# Patient Record
Sex: Male | Born: 1995
Health system: Southern US, Community
[De-identification: ages and names within clinical notes are randomized; demographics above are authoritative.]

## PROBLEM LIST (undated history)

## (undated) DIAGNOSIS — J45909 Unspecified asthma, uncomplicated: Secondary | ICD-10-CM

---
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
## 2017-05-12 ENCOUNTER — Encounter (HOSPITAL_COMMUNITY): Payer: Self-pay | Admitting: *Deleted

## 2017-05-12 DIAGNOSIS — R197 Diarrhea, unspecified: Secondary | ICD-10-CM | POA: Insufficient documentation

## 2017-05-12 DIAGNOSIS — R112 Nausea with vomiting, unspecified: Secondary | ICD-10-CM | POA: Diagnosis present

## 2017-05-12 LAB — CBC
HEMATOCRIT: 40.6 % (ref 39.0–52.0)
HEMOGLOBIN: 14.2 g/dL (ref 13.0–17.0)
MCH: 27.9 pg (ref 26.0–34.0)
MCHC: 35 g/dL (ref 30.0–36.0)
MCV: 79.8 fL (ref 78.0–100.0)
Platelets: 240 10*3/uL (ref 150–400)
RBC: 5.09 MIL/uL (ref 4.22–5.81)
RDW: 12.5 % (ref 11.5–15.5)
WBC: 9 10*3/uL (ref 4.0–10.5)

## 2017-05-12 LAB — COMPREHENSIVE METABOLIC PANEL
ALT: 14 U/L — ABNORMAL LOW (ref 17–63)
ANION GAP: 14 (ref 5–15)
AST: 23 U/L (ref 15–41)
Albumin: 4.7 g/dL (ref 3.5–5.0)
Alkaline Phosphatase: 43 U/L (ref 38–126)
BUN: 17 mg/dL (ref 6–20)
CO2: 23 mmol/L (ref 22–32)
Calcium: 9.6 mg/dL (ref 8.9–10.3)
Chloride: 103 mmol/L (ref 101–111)
Creatinine, Ser: 1.16 mg/dL (ref 0.61–1.24)
GFR calc Af Amer: >60 mL/min (ref >60)
GFR calc non Af Amer: >60 mL/min (ref >60)
GLUCOSE: 90 mg/dL (ref 65–99)
POTASSIUM: 3.6 mmol/L (ref 3.5–5.1)
SODIUM: 140 mmol/L (ref 135–145)
Total Bilirubin: 1.2 mg/dL (ref 0.3–1.2)
Total Protein: 7.9 g/dL (ref 6.5–8.1)

## 2017-05-12 LAB — URINALYSIS, ROUTINE W REFLEX MICROSCOPIC
BACTERIA UA: NONE SEEN
Bilirubin Urine: NEGATIVE
Glucose, UA: NEGATIVE mg/dL
Hgb urine dipstick: NEGATIVE
Ketones, ur: 80 mg/dL — AB
Leukocytes, UA: NEGATIVE
Nitrite: NEGATIVE
PROTEIN: 30 mg/dL — AB
SQUAMOUS EPITHELIAL / LPF: NONE SEEN
Specific Gravity, Urine: 1.029 (ref 1.005–1.030)
pH: 7 (ref 5.0–8.0)

## 2017-05-12 LAB — LIPASE, BLOOD: Lipase: 29 U/L (ref 11–51)

## 2017-05-12 NOTE — ED Triage Notes (Signed)
Pt mother reports vomiting since Sunday, has been using Phenergan (last dose this afternoon without relief) Se[facility], dx with gastritis. Seen at a doctor today as well, and directed here for further eval.

## 2017-05-13 ENCOUNTER — Emergency Department (HOSPITAL_COMMUNITY)
Admission: EM | Admit: 2017-05-13 | Discharge: 2017-05-13 | Disposition: A | Discharge status: Home / Self Care | Payer: PRIVATE HEALTH INSURANCE | Attending: Emergency Medicine | Admitting: Emergency Medicine

## 2017-05-13 ENCOUNTER — Other Ambulatory Visit: Payer: Self-pay

## 2017-05-13 DIAGNOSIS — R197 Diarrhea, unspecified: Secondary | ICD-10-CM

## 2017-05-13 DIAGNOSIS — R112 Nausea with vomiting, unspecified: Secondary | ICD-10-CM

## 2017-05-13 MED ORDER — ONDANSETRON 8 MG PO TBDP: 8.0000 mg | ORAL_TABLET | Freq: Three times a day (TID) | ORAL | 0 refills | Status: DC | PRN

## 2017-05-13 MED ORDER — SODIUM CHLORIDE 0.9 % IV BOLUS (SEPSIS)
1000.0000 mL | Freq: Once | INTRAVENOUS | Status: AC
Start: 2017-05-13 — End: 2017-05-13
  Administered 2017-05-13: 1000 mL via INTRAVENOUS

## 2017-05-13 MED ORDER — ONDANSETRON HCL 4 MG/2ML IJ SOLN
4.0000 mg | Freq: Once | INTRAMUSCULAR | Status: AC
  Administered 2017-05-13: 4 mg via INTRAVENOUS
  Filled 2017-05-13: qty 2

## 2017-05-13 NOTE — ED Provider Notes (Signed)
[facility] EMERGENCY DEPARTMENT Provider Note   CSN: [id number] Arrival date & time: 05/12/17  1844     History   Chief Complaint Chief Complaint  Patient presents with  . Emesis    HPI Luke Lopez is a 22 y.o. male.  The history is provided by the patient and a parent.  Emesis   This is a new problem. The current episode started more than 2 days ago. The problem has been gradually worsening. The emesis has an appearance of stomach contents. There has been no fever. Associated symptoms include diarrhea. Pertinent negatives include no abdominal pain, no chills, no cough, no fever and no sweats.  Patient presents with vomiting and diarrhea. He reports since last weekend patient has had multiple episodes of nonbloody vomiting, and a few episodes of nonbloody diarrhea. No abdominal pain, but does feel like he has some heartburn. No fever. He has no other medical problems He is already been seen in the emergency department in [place] for this, but is still having vomiting despite home nausea meds He is also been started on a PPI by an outpatient provider Mother's concern he may have "gallbladder issues "   PMH -none Soc hx - no travel Patient Active Problem List   Diagnosis Date Noted  . TENDINITIS, RIGHT HIP 01/05/2010    History reviewed. No pertinent surgical history.     Home Medications    Prior to Admission medications   Not on File    Family History No family history on file.  Social History Social History   Tobacco Use  . Smoking status: Never Smoker  Substance Use Topics  . Alcohol use: Yes  . Drug use: Yes    Types: Marijuana    Comment: last use monday night     Allergies   Patient has no known allergies.   Review of Systems Review of Systems  Constitutional: Negative for chills and fever.  Respiratory: Negative for cough.   Gastrointestinal: Positive for diarrhea and vomiting. Negative for abdominal pain.  All other systems  reviewed and are negative.    Physical Exam Updated Vital Signs BP 106/66 (BP Location: Left Arm)   Pulse 67   Temp 97.8 F (36.6 C) (Oral)   Resp 17   Ht 1.778 m (5\' 10" )   Wt 70.3 kg (155 lb)   SpO2 98%   BMI 22.24 kg/m   Physical Exam CONSTITUTIONAL: Well developed/well nourished HEAD: Normocephalic/atraumatic EYES: EOMI/PERRL, no icterus ENMT: Mucous membranes moist NECK: supple no meningeal signs SPINE/BACK:entire spine nontender CV: S1/S2 noted, no murmurs/rubs/gallops noted LUNGS: Lungs are clear to auscultation bilaterally, no apparent distress ABDOMEN: soft, nontender, no rebound or guarding, bowel sounds noted throughout abdomen GU:no cva tenderness NEURO: Pt is awake/alert/appropriate, moves all extremitiesx4.  No facial droop.   EXTREMITIES: pulses normal/equal, full ROM SKIN: warm, color normal PSYCH: no abnormalities of mood noted, alert and oriented to situation   ED Treatments / Results  Labs (all labs ordered are listed, but only abnormal results are displayed) Labs Reviewed  COMPREHENSIVE METABOLIC PANEL - Abnormal; Notable for the following components:      Result Value   ALT 14 (*)    All other components within normal limits  URINALYSIS, ROUTINE W REFLEX MICROSCOPIC - Abnormal; Notable for the following components:   APPearance HAZY (*)    Ketones, ur 80 (*)    Protein, ur 30 (*)    All other components within normal limits  LIPASE, BLOOD  CBC  EKG  EKG Interpretation None       Radiology No results found.  Procedures Procedures   Medications Ordered in ED Medications  sodium chloride 0.9 % bolus 1,000 mL (0 mLs Intravenous Stopped 05/13/17 0303)  ondansetron (ZOFRAN) injection 4 mg (4 mg Intravenous Given 05/13/17 0224)     Initial Impression / Assessment and Plan / ED Course  I have reviewed the triage vital signs and the nursing notes.  Pertinent labs results that were available during my care of the patient were reviewed  by me and considered in my medical decision making (see chart for details).     Patient well-appearing, no focal abdominal tenderness, labs overall reassuring Mother is convinced this could be his gallbladder. I did order a next a gallbladder ultrasound. I do not feel that CT imaging is required   Patient improved, eating crackers , eating chips.  He will return later in the day for ultrasound imaging.  Final Clinical Impressions(s) / ED Diagnoses   Final diagnoses:  Nausea vomiting and diarrhea    ED Discharge Orders        Ordered    US ABDOMEN LIMITED RUQ     05/13/17 0248    ondansetron (ZOFRAN ODT) 8 MG disintegrating tablet  Every 8 hours PRN     05/13/17 0332       Zadie RhineWickline, Gunnard Dorrance, MD 05/13/17 0740

## 2017-05-13 NOTE — Discharge Instructions (Addendum)
[phone] Call after 7:45am

## 2017-05-13 NOTE — ED Notes (Signed)
Pt resting comfortably and has not vomited since brought back to room

## 2017-05-13 NOTE — ED Notes (Signed)
Pt tolerating po liquids and po carbohydrates

## 2017-05-16 ENCOUNTER — Ambulatory Visit (HOSPITAL_COMMUNITY)
Admission: RE | Admit: 2017-05-16 | Discharge: 2017-05-16 | Disposition: A | Discharge status: Home / Self Care | Payer: PRIVATE HEALTH INSURANCE | Source: Ambulatory Visit | Attending: Emergency Medicine | Admitting: Emergency Medicine

## 2017-05-16 DIAGNOSIS — R1110 Vomiting, unspecified: Secondary | ICD-10-CM | POA: Diagnosis not present

## 2017-05-16 NOTE — ED Provider Notes (Addendum)
Abdominal ultrasound negative.  Patient did not wait for results.  He requested results to be faxed to local Medical [facility].  I have given the okay for that   Doug SouJacubowitz, Lizania Bouchard, MD 05/16/17 1222    Doug SouJacubowitz, Granite Godman, MD 05/16/17 [id number]

## 2018-05-19 DIAGNOSIS — J101 Influenza due to other identified influenza virus with other respiratory manifestations: Secondary | ICD-10-CM | POA: Diagnosis not present

## 2018-05-19 DIAGNOSIS — R6889 Other general symptoms and signs: Secondary | ICD-10-CM | POA: Diagnosis not present

## 2018-05-19 DIAGNOSIS — Z6824 Body mass index (BMI) 24.0-24.9, adult: Secondary | ICD-10-CM | POA: Diagnosis not present

## 2018-05-19 DIAGNOSIS — R070 Pain in throat: Secondary | ICD-10-CM | POA: Diagnosis not present

## 2018-08-13 ENCOUNTER — Encounter (HOSPITAL_COMMUNITY): Payer: Self-pay | Admitting: Emergency Medicine

## 2018-08-13 ENCOUNTER — Emergency Department (HOSPITAL_COMMUNITY): Payer: BC Managed Care – PPO

## 2018-08-13 ENCOUNTER — Emergency Department (HOSPITAL_COMMUNITY)
Admission: EM | Admit: 2018-08-13 | Discharge: 2018-08-13 | Disposition: A | Discharge status: Home / Self Care | Payer: BC Managed Care – PPO | Attending: Emergency Medicine | Admitting: Emergency Medicine

## 2018-08-13 ENCOUNTER — Other Ambulatory Visit: Payer: Self-pay

## 2018-08-13 DIAGNOSIS — R197 Diarrhea, unspecified: Secondary | ICD-10-CM | POA: Insufficient documentation

## 2018-08-13 DIAGNOSIS — R112 Nausea with vomiting, unspecified: Secondary | ICD-10-CM | POA: Insufficient documentation

## 2018-08-13 DIAGNOSIS — R1110 Vomiting, unspecified: Secondary | ICD-10-CM | POA: Diagnosis not present

## 2018-08-13 DIAGNOSIS — F17210 Nicotine dependence, cigarettes, uncomplicated: Secondary | ICD-10-CM | POA: Insufficient documentation

## 2018-08-13 LAB — COMPREHENSIVE METABOLIC PANEL
ALT: 21 U/L (ref 0–44)
AST: 24 U/L (ref 15–41)
Albumin: 4.8 g/dL (ref 3.5–5.0)
Alkaline Phosphatase: 51 U/L (ref 38–126)
Anion gap: 15 (ref 5–15)
BUN: 24 mg/dL — ABNORMAL HIGH (ref 6–20)
CO2: 24 mmol/L (ref 22–32)
Calcium: 9.4 mg/dL (ref 8.9–10.3)
Chloride: 97 mmol/L — ABNORMAL LOW (ref 98–111)
Creatinine, Ser: 1.37 mg/dL — ABNORMAL HIGH (ref 0.61–1.24)
GFR calc Af Amer: >60 mL/min (ref >60)
GFR calc non Af Amer: >60 mL/min (ref >60)
Glucose, Bld: 95 mg/dL (ref 70–99)
Potassium: 2.9 mmol/L — ABNORMAL LOW (ref 3.5–5.1)
Sodium: 136 mmol/L (ref 135–145)
Total Bilirubin: 1.3 mg/dL — ABNORMAL HIGH (ref 0.3–1.2)
Total Protein: 7.9 g/dL (ref 6.5–8.1)

## 2018-08-13 LAB — URINALYSIS, ROUTINE W REFLEX MICROSCOPIC
Bilirubin Urine: NEGATIVE
Glucose, UA: NEGATIVE mg/dL
Ketones, ur: 80 mg/dL — AB
Leukocytes,Ua: NEGATIVE
Nitrite: NEGATIVE
Protein, ur: NEGATIVE mg/dL
Specific Gravity, Urine: 1.034 — ABNORMAL HIGH (ref 1.005–1.030)
pH: 5 (ref 5.0–8.0)

## 2018-08-13 LAB — CBC WITH DIFFERENTIAL/PLATELET
Abs Immature Granulocytes: 0.04 10*3/uL (ref 0.00–0.07)
Basophils Absolute: 0 10*3/uL (ref 0.0–0.1)
Basophils Relative: 0 %
Eosinophils Absolute: 0 10*3/uL (ref 0.0–0.5)
Eosinophils Relative: 0 %
HCT: 45 % (ref 39.0–52.0)
Hemoglobin: 15.4 g/dL (ref 13.0–17.0)
Immature Granulocytes: 0 %
Lymphocytes Relative: 16 %
Lymphs Abs: 1.6 10*3/uL (ref 0.7–4.0)
MCH: 28.1 pg (ref 26.0–34.0)
MCHC: 34.2 g/dL (ref 30.0–36.0)
MCV: 82 fL (ref 80.0–100.0)
Monocytes Absolute: 1.2 10*3/uL — ABNORMAL HIGH (ref 0.1–1.0)
Monocytes Relative: 12 %
Neutro Abs: 7.1 10*3/uL (ref 1.7–7.7)
Neutrophils Relative %: 72 %
Platelets: 268 10*3/uL (ref 150–400)
RBC: 5.49 MIL/uL (ref 4.22–5.81)
RDW: 12.4 % (ref 11.5–15.5)
WBC: 10 10*3/uL (ref 4.0–10.5)
nRBC: 0 % (ref 0.0–0.2)

## 2018-08-13 LAB — ETHANOL: Alcohol, Ethyl (B): <10 mg/dL (ref <10)

## 2018-08-13 LAB — RAPID URINE DRUG SCREEN, HOSP PERFORMED
Amphetamines: NOT DETECTED
Barbiturates: NOT DETECTED
Benzodiazepines: NOT DETECTED
Cocaine: NOT DETECTED
Opiates: NOT DETECTED
Tetrahydrocannabinol: POSITIVE — AB

## 2018-08-13 LAB — LIPASE, BLOOD: Lipase: 27 U/L (ref 11–51)

## 2018-08-13 MED ORDER — FAMOTIDINE IN NACL 20-0.9 MG/50ML-% IV SOLN
20.0000 mg | Freq: Once | INTRAVENOUS | Status: AC
  Administered 2018-08-13: 20 mg via INTRAVENOUS
  Filled 2018-08-13: qty 50

## 2018-08-13 MED ORDER — SODIUM CHLORIDE 0.9 % IV BOLUS
1000.0000 mL | Freq: Once | INTRAVENOUS | Status: AC
  Administered 2018-08-13: 19:00:00 1000 mL via INTRAVENOUS

## 2018-08-13 MED ORDER — POTASSIUM CHLORIDE CRYS ER 20 MEQ PO TBCR
40.0000 meq | EXTENDED_RELEASE_TABLET | Freq: Once | ORAL | Status: AC
Start: 2018-08-13 — End: 2018-08-13
  Administered 2018-08-13: 19:00:00 40 meq via ORAL
  Filled 2018-08-13: qty 2

## 2018-08-13 MED ORDER — SODIUM CHLORIDE 0.9 % IV BOLUS
1000.0000 mL | Freq: Once | INTRAVENOUS | Status: AC
  Administered 2018-08-13: 1000 mL via INTRAVENOUS

## 2018-08-13 MED ORDER — PROMETHAZINE HCL 25 MG/ML IJ SOLN
12.5000 mg | Freq: Once | INTRAMUSCULAR | Status: AC
  Administered 2018-08-13: 12.5 mg via INTRAVENOUS
  Filled 2018-08-13: qty 1

## 2018-08-13 MED ORDER — PROMETHAZINE HCL 25 MG PO TABS: 25.0000 mg | ORAL_TABLET | Freq: Four times a day (QID) | ORAL | 0 refills | Status: DC | PRN

## 2018-08-13 NOTE — Discharge Instructions (Signed)
Take the prescriptions as directed.  Increase your fluid intake (ie:  Gatoraide) for the next few days, as discussed.  Eat a bland diet and advance to your regular diet slowly as you can tolerate it.   Avoid full strength juices, as well as milk and milk products until your diarrhea has resolved.   Call your regular medical doctor Monday to schedule a follow up appointment this week.  Return to the Emergency Department immediately sooner if worsening.

## 2018-08-13 NOTE — ED Notes (Signed)
Pt given sprite 

## 2018-08-13 NOTE — ED Triage Notes (Signed)
Patient states nausea, vomiting, and diarrhea x 4 days. States he has had episodes like this before and that they are based on anxiety.

## 2018-08-13 NOTE — ED Provider Notes (Signed)
[facility] EMERGENCY DEPARTMENT Provider Note   CSN: [id number] Arrival date & time: 08/13/18  1708    History   Chief Complaint Chief Complaint  Patient presents with  . Emesis  . Nausea    HPI Luke Lopez is a 23 y.o. male.     HPI  Pt was seen at 1715. Per pt, c/o gradual onset and persistence of multiple intermittent episodes of N/V/D that began 3 days ago. Pt states his symptoms began after he "got upset" after a friend's funeral. States he has had these symptoms previously, generally occurring when he has anxiety. Pt has not f/u with PMD or GI MD for his symptoms. Denies abd pain, no CP/SOB, no back pain, no fevers, no black or blood in stools or emesis.    History reviewed. No pertinent past medical history.  Patient Active Problem List   Diagnosis Date Noted  . TENDINITIS, RIGHT HIP 01/05/2010    No past surgical history on file.      Home Medications    Prior to Admission medications   Medication Sig Start Date End Date Taking? Authorizing Provider  ondansetron (ZOFRAN ODT) 8 MG disintegrating tablet Take 1 tablet (8 mg total) by mouth every 8 (eight) hours as needed. 8mg  ODT q4 hours prn nausea 05/13/17   Zadie Rhine, MD    Family History No family history on file.  Social History Social History   Tobacco Use  . Smoking status: Current Every Day Smoker    Packs/day: 0.50    Types: Cigarettes  . Smokeless tobacco: Never Used  Substance Use Topics  . Alcohol use: Yes  . Drug use: Yes    Types: Marijuana    Comment: last use monday night     Allergies   Patient has no known allergies.   Review of Systems Review of Systems ROS: Statement: All systems negative except as marked or noted in the HPI; Constitutional: Negative for fever and chills. ; ; Eyes: Negative for eye pain, redness and discharge. ; ; ENMT: Negative for ear pain, hoarseness, nasal congestion, sinus pressure and sore throat. ; ; Cardiovascular: Negative for  chest pain, palpitations, diaphoresis, dyspnea and peripheral edema. ; ; Respiratory: Negative for cough, wheezing and stridor. ; ; Gastrointestinal: +N/V/D. Negative for abdominal pain, blood in stool, hematemesis, jaundice and rectal bleeding. . ; ; Genitourinary: Negative for dysuria, flank pain and hematuria. ; ; Musculoskeletal: Negative for back pain and neck pain. Negative for swelling and trauma.; ; Skin: Negative for pruritus, rash, abrasions, blisters, bruising and skin lesion.; ; Neuro: Negative for headache, lightheadedness and neck stiffness. Negative for weakness, altered level of consciousness, altered mental status, extremity weakness, paresthesias, involuntary movement, seizure and syncope.       Physical Exam Updated Vital Signs BP 136/78 (BP Location: Right Arm)   Pulse (!) 49   Temp 99.3 F (37.4 C) (Oral)   Resp 16   Ht 5\' 10"  (1.778 m)   Wt 77.1 kg   SpO2 99%   BMI 24.39 kg/m   Physical Exam 1720: Physical examination:  Nursing notes reviewed; Vital signs and O2 SAT reviewed;  Constitutional: Well developed, Well nourished, Well hydrated, In no acute distress; Head:  Normocephalic, atraumatic; Eyes: EOMI, PERRL, No scleral icterus; ENMT: Mouth and pharynx normal, Mucous membranes moist; Neck: Supple, Full range of motion, No lymphadenopathy; Cardiovascular: Regular rate and rhythm, No gallop; Respiratory: Breath sounds clear & equal bilaterally, No wheezes.  Speaking full sentences with ease, Normal respiratory  effort/excursion; Chest: Nontender, Movement normal; Abdomen: Soft, Nontender, Nondistended, Normal bowel sounds; Genitourinary: No CVA tenderness; Extremities: Peripheral pulses normal, No tenderness, No edema, No calf edema or asymmetry.; Neuro: AA&Ox3, Major CN grossly intact.  Speech clear. No gross focal motor or sensory deficits in extremities.; Skin: Color normal, Warm, Dry.   ED Treatments / Results  Labs (all labs ordered are listed, but only abnormal  results are displayed)   EKG None  Radiology   Procedures Procedures (including critical care time)  Medications Ordered in ED Medications  sodium chloride 0.9 % bolus 1,000 mL (has no administration in time range)  promethazine (PHENERGAN) injection 12.5 mg (has no administration in time range)  famotidine (PEPCID) IVPB 20 mg premix (has no administration in time range)     Initial Impression / Assessment and Plan / ED Course  I have reviewed the triage vital signs and the nursing notes.  Pertinent labs & imaging results that were available during my care of the patient were reviewed by me and considered in my medical decision making (see chart for details).     MDM Reviewed: previous chart, nursing note and vitals Reviewed previous: labs Interpretation: labs and x-ray    Results for orders placed or performed during the hospital encounter of 08/13/18  Urine rapid drug screen (hosp performed)  Result Value Ref Range   Opiates NONE DETECTED NONE DETECTED   Cocaine NONE DETECTED NONE DETECTED   Benzodiazepines NONE DETECTED NONE DETECTED   Amphetamines NONE DETECTED NONE DETECTED   Tetrahydrocannabinol POSITIVE (A) NONE DETECTED   Barbiturates NONE DETECTED NONE DETECTED  Comprehensive metabolic panel  Result Value Ref Range   Sodium 136 135 - 145 mmol/L   Potassium 2.9 (L) 3.5 - 5.1 mmol/L   Chloride 97 (L) 98 - 111 mmol/L   CO2 24 22 - 32 mmol/L   Glucose, Bld 95 70 - 99 mg/dL   BUN 24 (H) 6 - 20 mg/dL   Creatinine, Ser 4.091.37 (H) 0.61 - 1.24 mg/dL   Calcium 9.4 8.9 - 81.110.3 mg/dL   Total Protein 7.9 6.5 - 8.1 g/dL   Albumin 4.8 3.5 - 5.0 g/dL   AST 24 15 - 41 U/L   ALT 21 0 - 44 U/L   Alkaline Phosphatase 51 38 - 126 U/L   Total Bilirubin 1.3 (H) 0.3 - 1.2 mg/dL   GFR calc non Af Amer >60 >60 mL/min   GFR calc Af Amer >60 >60 mL/min   Anion gap 15 5 - 15  Ethanol  Result Value Ref Range   Alcohol, Ethyl (B) <10 <10 mg/dL  Lipase, blood  Result Value Ref  Range   Lipase 27 11 - 51 U/L  CBC with Differential  Result Value Ref Range   WBC 10.0 4.0 - 10.5 K/uL   RBC 5.49 4.22 - 5.81 MIL/uL   Hemoglobin 15.4 13.0 - 17.0 g/dL   HCT 91.445.0 78.239.0 - 95.652.0 %   MCV 82.0 80.0 - 100.0 fL   MCH 28.1 26.0 - 34.0 pg   MCHC 34.2 30.0 - 36.0 g/dL   RDW 21.312.4 08.611.5 - 57.815.5 %   Platelets 268 150 - 400 K/uL   nRBC 0.0 0.0 - 0.2 %   Neutrophils Relative % 72 %   Neutro Abs 7.1 1.7 - 7.7 K/uL   Lymphocytes Relative 16 %   Lymphs Abs 1.6 0.7 - 4.0 K/uL   Monocytes Relative 12 %   Monocytes Absolute 1.2 (H) 0.1 - 1.0 K/uL  Eosinophils Relative 0 %   Eosinophils Absolute 0.0 0.0 - 0.5 K/uL   Basophils Relative 0 %   Basophils Absolute 0.0 0.0 - 0.1 K/uL   Immature Granulocytes 0 %   Abs Immature Granulocytes 0.04 0.00 - 0.07 K/uL  Urinalysis, Routine w reflex microscopic  Result Value Ref Range   Color, Urine YELLOW YELLOW   APPearance CLEAR CLEAR   Specific Gravity, Urine 1.034 (H) 1.005 - 1.030   pH 5.0 5.0 - 8.0   Glucose, UA NEGATIVE NEGATIVE mg/dL   Hgb urine dipstick SMALL (A) NEGATIVE   Bilirubin Urine NEGATIVE NEGATIVE   Ketones, ur 80 (A) NEGATIVE mg/dL   Protein, ur NEGATIVE NEGATIVE mg/dL   Nitrite NEGATIVE NEGATIVE   Leukocytes,Ua NEGATIVE NEGATIVE   RBC / HPF 0-5 0 - 5 RBC/hpf   WBC, UA 0-5 0 - 5 WBC/hpf   Bacteria, UA RARE (A) NONE SEEN   Mucus PRESENT    Dg Abd Acute W/chest Result Date: 08/13/2018 CLINICAL DATA:  23 year old male with a history of vomiting EXAM: DG ABDOMEN ACUTE W/ 1V CHEST COMPARISON:  None. FINDINGS: Chest: Cardiomediastinal silhouette within normal limits in size and contour. No evidence of central vascular congestion. No pneumothorax or pleural effusion. Abdomen: Gas within stomach, small bowel, colon. No abnormal distension. No air-fluid levels. No radiopaque foreign body. No unexpected soft tissue density or calcification. No displaced fracture IMPRESSION: Chest: No radiographic evidence of acute cardiopulmonary  disease. Abdomen: Normal bowel gas pattern. Electronically Signed   By: Gilmer Mor D.O.   On: 08/13/2018 18:43    1935:  Potassium repleted PO. BUN/Cr mildly elevated from baseline; IVF given. Pt has tol PO well while in the ED without N/V.  No stooling while in the ED.  Abd remains benign, resps easy, VSS. Feels better and wants to go home now. Tx symptomatically at this time. Dx and testing d/w pt.  Questions answered.  Verb understanding, agreeable to d/c home with outpt f/u.    Final Clinical Impressions(s) / ED Diagnoses   Final diagnoses:  None    ED Discharge Orders    None       Samuel Jester, DO 08/17/18 4034

## 2018-08-15 DIAGNOSIS — R112 Nausea with vomiting, unspecified: Secondary | ICD-10-CM | POA: Diagnosis not present

## 2018-08-15 DIAGNOSIS — Z6822 Body mass index (BMI) 22.0-22.9, adult: Secondary | ICD-10-CM | POA: Diagnosis not present

## 2018-08-18 DIAGNOSIS — K2990 Gastroduodenitis, unspecified, without bleeding: Secondary | ICD-10-CM | POA: Diagnosis not present

## 2018-08-18 DIAGNOSIS — K219 Gastro-esophageal reflux disease without esophagitis: Secondary | ICD-10-CM | POA: Diagnosis not present

## 2018-08-19 ENCOUNTER — Encounter (HOSPITAL_COMMUNITY): Payer: Self-pay | Admitting: Emergency Medicine

## 2018-08-19 ENCOUNTER — Emergency Department (HOSPITAL_COMMUNITY)
Admission: EM | Admit: 2018-08-19 | Discharge: 2018-08-19 | Disposition: A | Discharge status: Home / Self Care | Payer: BC Managed Care – PPO | Attending: Emergency Medicine | Admitting: Emergency Medicine

## 2018-08-19 ENCOUNTER — Other Ambulatory Visit: Payer: Self-pay

## 2018-08-19 DIAGNOSIS — R112 Nausea with vomiting, unspecified: Secondary | ICD-10-CM | POA: Diagnosis not present

## 2018-08-19 DIAGNOSIS — Z79899 Other long term (current) drug therapy: Secondary | ICD-10-CM | POA: Insufficient documentation

## 2018-08-19 DIAGNOSIS — E860 Dehydration: Secondary | ICD-10-CM | POA: Diagnosis not present

## 2018-08-19 DIAGNOSIS — E876 Hypokalemia: Secondary | ICD-10-CM | POA: Insufficient documentation

## 2018-08-19 DIAGNOSIS — F17210 Nicotine dependence, cigarettes, uncomplicated: Secondary | ICD-10-CM | POA: Insufficient documentation

## 2018-08-19 LAB — COMPREHENSIVE METABOLIC PANEL
ALT: 13 U/L (ref 0–44)
AST: 19 U/L (ref 15–41)
Albumin: 4.9 g/dL (ref 3.5–5.0)
Alkaline Phosphatase: 48 U/L (ref 38–126)
Anion gap: 14 (ref 5–15)
BUN: 18 mg/dL (ref 6–20)
CO2: 27 mmol/L (ref 22–32)
Calcium: 9.2 mg/dL (ref 8.9–10.3)
Chloride: 92 mmol/L — ABNORMAL LOW (ref 98–111)
Creatinine, Ser: 1.24 mg/dL (ref 0.61–1.24)
GFR calc Af Amer: >60 mL/min (ref >60)
GFR calc non Af Amer: >60 mL/min (ref >60)
Glucose, Bld: 94 mg/dL (ref 70–99)
Potassium: 2.9 mmol/L — ABNORMAL LOW (ref 3.5–5.1)
Sodium: 133 mmol/L — ABNORMAL LOW (ref 135–145)
Total Bilirubin: 1.4 mg/dL — ABNORMAL HIGH (ref 0.3–1.2)
Total Protein: 8.1 g/dL (ref 6.5–8.1)

## 2018-08-19 LAB — RAPID URINE DRUG SCREEN, HOSP PERFORMED
Amphetamines: NOT DETECTED
Barbiturates: NOT DETECTED
Benzodiazepines: NOT DETECTED
Cocaine: NOT DETECTED
Opiates: NOT DETECTED
Tetrahydrocannabinol: POSITIVE — AB

## 2018-08-19 LAB — CBC WITH DIFFERENTIAL/PLATELET
Abs Immature Granulocytes: 0.02 10*3/uL (ref 0.00–0.07)
Basophils Absolute: 0 10*3/uL (ref 0.0–0.1)
Basophils Relative: 0 %
Eosinophils Absolute: 0 10*3/uL (ref 0.0–0.5)
Eosinophils Relative: 0 %
HCT: 46.9 % (ref 39.0–52.0)
Hemoglobin: 16.2 g/dL (ref 13.0–17.0)
Immature Granulocytes: 0 %
Lymphocytes Relative: 21 %
Lymphs Abs: 1.6 10*3/uL (ref 0.7–4.0)
MCH: 27.8 pg (ref 26.0–34.0)
MCHC: 34.5 g/dL (ref 30.0–36.0)
MCV: 80.6 fL (ref 80.0–100.0)
Monocytes Absolute: 0.9 10*3/uL (ref 0.1–1.0)
Monocytes Relative: 12 %
Neutro Abs: 5.1 10*3/uL (ref 1.7–7.7)
Neutrophils Relative %: 67 %
Platelets: 314 10*3/uL (ref 150–400)
RBC: 5.82 MIL/uL — ABNORMAL HIGH (ref 4.22–5.81)
RDW: 12 % (ref 11.5–15.5)
WBC: 7.7 10*3/uL (ref 4.0–10.5)
nRBC: 0 % (ref 0.0–0.2)

## 2018-08-19 LAB — LIPASE, BLOOD: Lipase: 35 U/L (ref 11–51)

## 2018-08-19 LAB — URINALYSIS, ROUTINE W REFLEX MICROSCOPIC
Bilirubin Urine: NEGATIVE
Glucose, UA: NEGATIVE mg/dL
Hgb urine dipstick: NEGATIVE
Ketones, ur: 80 mg/dL — AB
Leukocytes,Ua: NEGATIVE
Nitrite: NEGATIVE
Protein, ur: NEGATIVE mg/dL
Specific Gravity, Urine: 1.029 (ref 1.005–1.030)
pH: 5 (ref 5.0–8.0)

## 2018-08-19 MED ORDER — PROMETHAZINE HCL 25 MG RE SUPP: 25.0000 mg | Freq: Four times a day (QID) | RECTAL | 0 refills | Status: DC | PRN

## 2018-08-19 MED ORDER — POTASSIUM CHLORIDE CRYS ER 20 MEQ PO TBCR: 20.0000 meq | EXTENDED_RELEASE_TABLET | Freq: Two times a day (BID) | ORAL | 0 refills | Status: DC

## 2018-08-19 MED ORDER — PROMETHAZINE HCL 25 MG/ML IJ SOLN
INTRAMUSCULAR | Status: AC
  Administered 2018-08-19: 25 mg via INTRAVENOUS
  Filled 2018-08-19: qty 1

## 2018-08-19 MED ORDER — POTASSIUM CHLORIDE 10 MEQ/100ML IV SOLN
10.0000 meq | Freq: Once | INTRAVENOUS | Status: AC
  Administered 2018-08-19: 10 meq via INTRAVENOUS
  Filled 2018-08-19: qty 100

## 2018-08-19 MED ORDER — ALUM & MAG HYDROXIDE-SIMETH 200-200-20 MG/5ML PO SUSP
30.0000 mL | Freq: Once | ORAL | Status: AC
  Administered 2018-08-19: 12:00:00 30 mL via ORAL
  Filled 2018-08-19: qty 30

## 2018-08-19 MED ORDER — SODIUM CHLORIDE 0.9 % IV BOLUS
1000.0000 mL | Freq: Once | INTRAVENOUS | Status: AC
  Administered 2018-08-19: 1000 mL via INTRAVENOUS

## 2018-08-19 MED ORDER — LIDOCAINE VISCOUS HCL 2 % MT SOLN
15.0000 mL | Freq: Once | OROMUCOSAL | Status: AC
  Administered 2018-08-19: 15 mL via ORAL
  Filled 2018-08-19: qty 15

## 2018-08-19 MED ORDER — PROMETHAZINE HCL 25 MG/ML IJ SOLN
25.0000 mg | Freq: Once | INTRAMUSCULAR | Status: AC
  Administered 2018-08-19: 25 mg via INTRAVENOUS

## 2018-08-19 NOTE — ED Triage Notes (Signed)
Pt c/o of n/v since last Thursday.  Was seen here 08/13/18 for same. No relief.  States he is sore in abdomen.

## 2018-08-19 NOTE — Discharge Instructions (Addendum)
I recommend using the phenergan suppositories in place of the other nausea medications including the reglan you were prescribed yesterday since this medicine seems to work the best for you.  I recommend using 25 mg of this every 6 hours for the next 24 hours, you may then back off if your symptoms allow.  I also recommend continuing taking the the pantoprazole you started yesterday, in addition adding either maalox or mylanta for additonal relief of your acid reflux symptoms is recommended.  DO NOT take reglan and phenergan together, hold the reglan for now as you are using the phenergan suppositories.  I recommend bland foods/ the b.r.a.t diet and frequent small sips of fluids to help maintain hydration.   Do not smoke marijuana as discussed as this can worsen nausea and vomiting.

## 2018-08-19 NOTE — ED Notes (Signed)
Water provided to the patient.

## 2018-08-19 NOTE — ED Provider Notes (Signed)
[facility] EMERGENCY DEPARTMENT Provider Note   CSN: [id number] Arrival date & time: 08/19/18  1005    History   Chief Complaint Chief Complaint  Patient presents with   Emesis    HPI Luke Lopez is a 23 y.o. male presenting with intractable nausea and non bloody vomiting in association with abdominal wall soreness from heaving, no specific abdominal pain, but has hunger sensation and intermittent upper abdominal cramping prior to emesis.  His symptoms have been persistent since April 2nd and suspects was initially due to stress of attending a friends funeral, but also may have been triggered by etoh intake that day.  He denies excessive drinking, reports drank 2 shots of liquor and one "airplane bottle" .  He does endorse 2 similar episodes of vomiting in the past each lasting about 4 days and one episode was triggered by excessive etoh.  He denies fevers, chills, diarrhea.  He was seen here on 4/5 at which time he was dehydrated with hypokalemia and was sent home with phenergan tablets which were not effective.  He was seen at an urgent care center on 4/7 and prescribed both oral zofran and rectal phenergan, stating the rectal phenergan was more effective, but he continues to vomit.  Saw his pcp ytd and switched to reglan and pantoprazole.  Reports approx 30 episodes of dry heaving/vomiting in the past 24 hours.  Wakes with severe acid in his throat.  Has been able to tolerate nibbles of crackers only, trial of b.r.a.t diet.  Reports 15 lb weight loss since this episode began.     The history is provided by the patient.    History reviewed. No pertinent past medical history.  Patient Active Problem List   Diagnosis Date Noted   TENDINITIS, RIGHT HIP 01/05/2010    History reviewed. No pertinent surgical history.      Home Medications    Prior to Admission medications   Medication Sig Start Date End Date Taking? Authorizing Provider  pantoprazole (PROTONIX) 40  MG tablet Take 40 mg by mouth daily.  08/18/18  Yes [provider]  ondansetron (ZOFRAN ODT) 8 MG disintegrating tablet Take 1 tablet (8 mg total) by mouth every 8 (eight) hours as needed.  ODT q4 hours prn nausea Patient not taking: Reported on 08/19/2018 05/13/17   Zadie Rhine, MD  potassium chloride SA (K-DUR,KLOR-CON) 20 MEQ tablet Take 1 tablet (20 mEq total) by mouth 2 (two) times daily. 08/19/18   Burgess Amor, PA-C  promethazine (PHENERGAN) 25 MG suppository Place 1 suppository (25 mg total) rectally every 6 (six) hours as needed for nausea or vomiting. 08/19/18   Burgess Amor, PA-C    Family History History reviewed. No pertinent family history.  Social History Social History   Tobacco Use   Smoking status: Current Every Day Smoker    Packs/day: 0.50    Types: Cigarettes   Smokeless tobacco: Never Used  Substance Use Topics   Alcohol use: Yes   Drug use: Yes    Types: Marijuana    Comment: last use monday night     Allergies   Patient has no known allergies.   Review of Systems Review of Systems  Constitutional: Positive for fatigue. Negative for fever.  HENT: Negative for congestion and sore throat.   Eyes: Negative.   Respiratory: Negative for chest tightness and shortness of breath.   Cardiovascular: Negative for chest pain.  Gastrointestinal: Positive for nausea and vomiting. Negative for abdominal pain, blood in stool, constipation  and diarrhea.  Genitourinary: Negative.   Musculoskeletal: Negative for arthralgias, joint swelling and neck pain.  Skin: Negative.  Negative for rash and wound.  Neurological: Positive for weakness. Negative for dizziness, light-headedness, numbness and headaches.  Psychiatric/Behavioral: Negative.      Physical Exam Updated Vital Signs BP 118/78    Pulse (!) 54    Temp 98.7 F (37.1 C) (Oral)    Resp 16    Ht 5\' 10"  (1.778 m)    Wt 70.3 kg    SpO2 100%    BMI 22.24 kg/m   Physical Exam Vitals signs and  nursing note reviewed.  Constitutional:      Appearance: He is well-developed.  HENT:     Head: Normocephalic and atraumatic.  Eyes:     Conjunctiva/sclera: Conjunctivae normal.  Neck:     Musculoskeletal: Normal range of motion.  Cardiovascular:     Rate and Rhythm: Normal rate and regular rhythm.     Heart sounds: Normal heart sounds.  Pulmonary:     Effort: Pulmonary effort is normal.     Breath sounds: Normal breath sounds. No wheezing.  Abdominal:     General: Bowel sounds are normal.     Palpations: Abdomen is soft. There is no mass.     Tenderness: There is no abdominal tenderness. There is no guarding or rebound.  Musculoskeletal: Normal range of motion.  Skin:    General: Skin is warm and dry.  Neurological:     Mental Status: He is alert.      ED Treatments / Results  Labs (all labs ordered are listed, but only abnormal results are displayed) Labs Reviewed  CBC WITH DIFFERENTIAL/PLATELET - Abnormal; Notable for the following components:      Result Value   RBC 5.82 (*)    All other components within normal limits  COMPREHENSIVE METABOLIC PANEL - Abnormal; Notable for the following components:   Sodium 133 (*)    Potassium 2.9 (*)    Chloride 92 (*)    Total Bilirubin 1.4 (*)    All other components within normal limits  URINALYSIS, ROUTINE W REFLEX MICROSCOPIC - Abnormal; Notable for the following components:   Ketones, ur 80 (*)    All other components within normal limits  RAPID URINE DRUG SCREEN, HOSP PERFORMED - Abnormal; Notable for the following components:   Tetrahydrocannabinol POSITIVE (*)    All other components within normal limits  LIPASE, BLOOD    EKG None  Radiology No results found.  Procedures Procedures (including critical care time)  Medications Ordered in ED Medications  promethazine (PHENERGAN) injection 25 mg (25 mg Intravenous Given 08/19/18 1104)  sodium chloride 0.9 % bolus 1,000 mL (0 mLs Intravenous Stopped 08/19/18  1155)  alum & mag hydroxide-simeth (MAALOX/MYLANTA) 200-200-20 MG/5ML suspension 30 mL (30 mLs Oral Given 08/19/18 1207)    And  lidocaine (XYLOCAINE) 2 % viscous mouth solution 15 mL (15 mLs Oral Given 08/19/18 1207)  potassium chloride 10 mEq in 100 mL IVPB (0 mEq Intravenous Stopped 08/19/18 1307)  sodium chloride 0.9 % bolus 1,000 mL (0 mLs Intravenous Stopped 08/19/18 1310)     Initial Impression / Assessment and Plan / ED Course  I have reviewed the triage vital signs and the nursing notes.  Pertinent labs & imaging results that were available during my care of the patient were reviewed by me and considered in my medical decision making (see chart for details).        Pt with  intractable emesis, none here after receiving phenergan.  He was moderately dehydrated with ketonuria, also with persistent hypokalemia of 2.9.  He was given an IV run 10 meq. He tolerated PO intake. Discussed role of marijuana as cause of n/v. Pt aware, states has had not smoked in 5 days.  He was given a dose of viscous lidocaine/maalox here, did not tolerate the taste but it improved his reflux/burning abd sx.  Advised to continue taking his pantoprazole which was started yesterday, recommended adding maalox for additional stomach protection.  Potassium prescribed, phenergan suppository also recommended (he has a few) will prescribe additional. Advised suppository q 6 hours for the next 24 hours, then can back off if sx are improving.   Final Clinical Impressions(s) / ED Diagnoses   Final diagnoses:  Non-intractable vomiting with nausea, unspecified vomiting type  Hypokalemia  Dehydration    ED Discharge Orders         Ordered    promethazine (PHENERGAN) 25 MG suppository  Every 6 hours PRN     08/19/18 1429    potassium chloride SA (K-DUR,KLOR-CON) 20 MEQ tablet  2 times daily     08/19/18 1430           Burgess Amordol, Nina Hoar, Cordelia Poche-C 08/19/18 1435    Linwood DibblesKnapp, Jon, MD 08/20/18 [phone]

## 2018-08-19 NOTE — ED Notes (Signed)
Pt given gingerale and crackers 

## 2018-08-24 DIAGNOSIS — Z630 Problems in relationship with spouse or partner: Secondary | ICD-10-CM | POA: Diagnosis not present

## 2018-08-24 DIAGNOSIS — F064 Anxiety disorder due to known physiological condition: Secondary | ICD-10-CM | POA: Diagnosis not present

## 2018-08-24 DIAGNOSIS — K2990 Gastroduodenitis, unspecified, without bleeding: Secondary | ICD-10-CM | POA: Diagnosis not present

## 2019-03-30 IMAGING — US US ABDOMEN LIMITED
1 series · 14 of 25 positions shown · non-contrast
Comparison: None.

CLINICAL DATA: Vomiting for 1 week

EXAM:
ULTRASOUND ABDOMEN LIMITED RIGHT UPPER QUADRANT

[Series 1: us abdomen limited · 0.16mm/px · 14 of 48 slices shown]
[im 1/48]
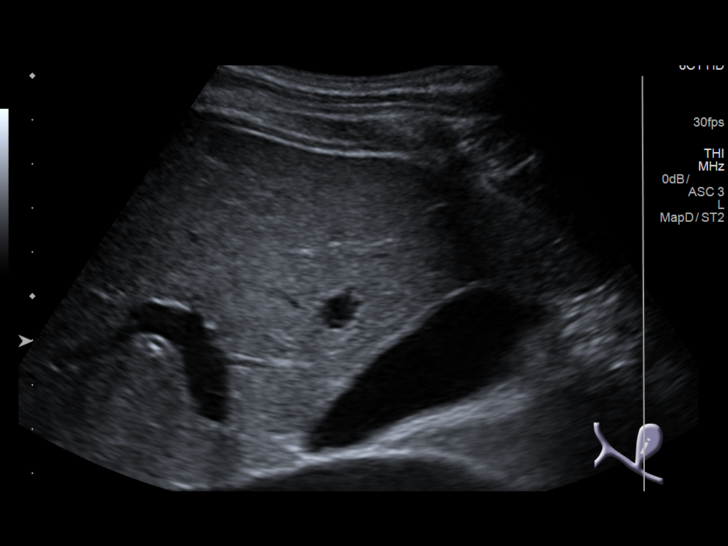
[im 4/48]
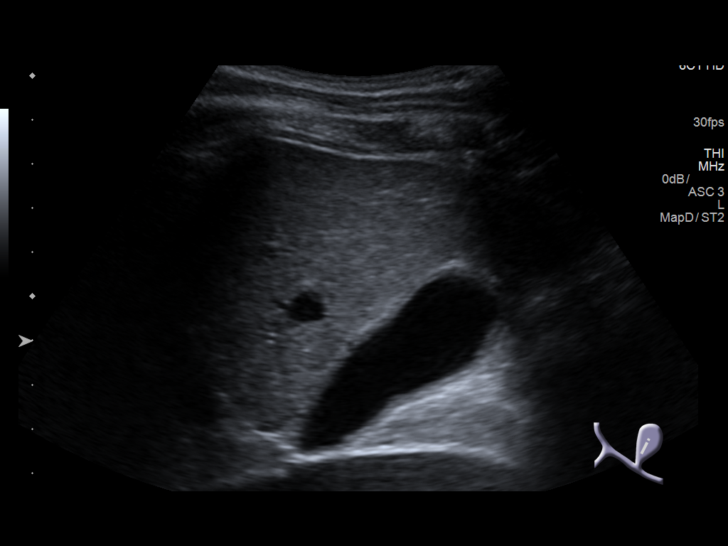
[im 8/48]
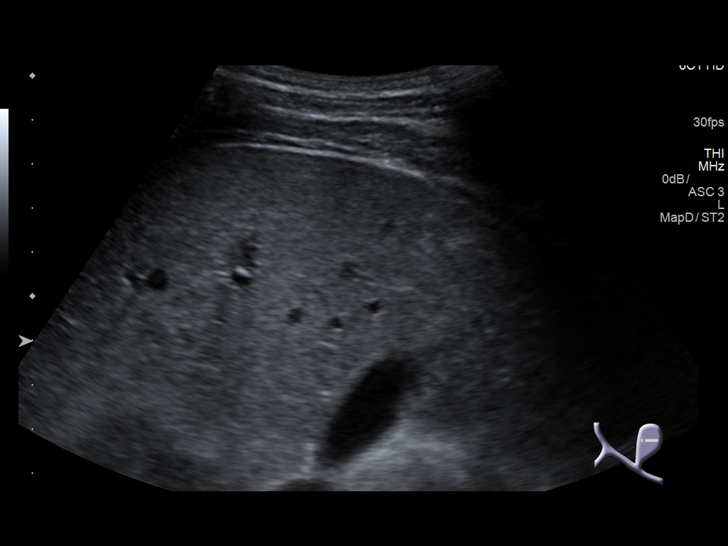
[im 12/48]
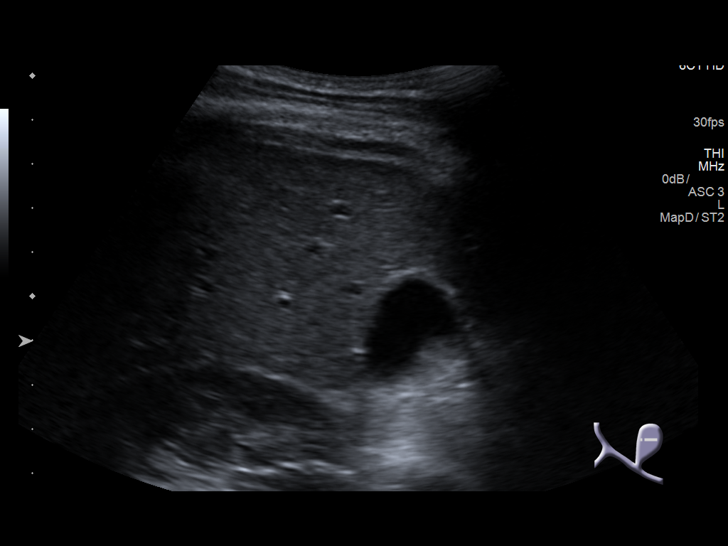
[im 16/48]
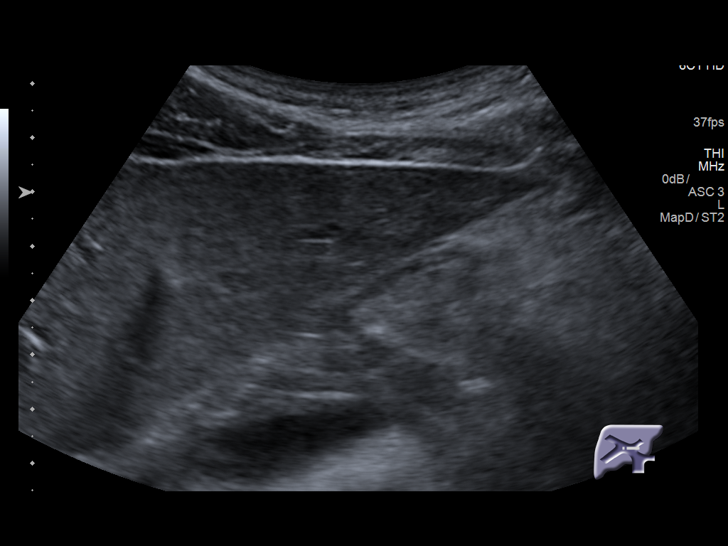
[im 18/48]
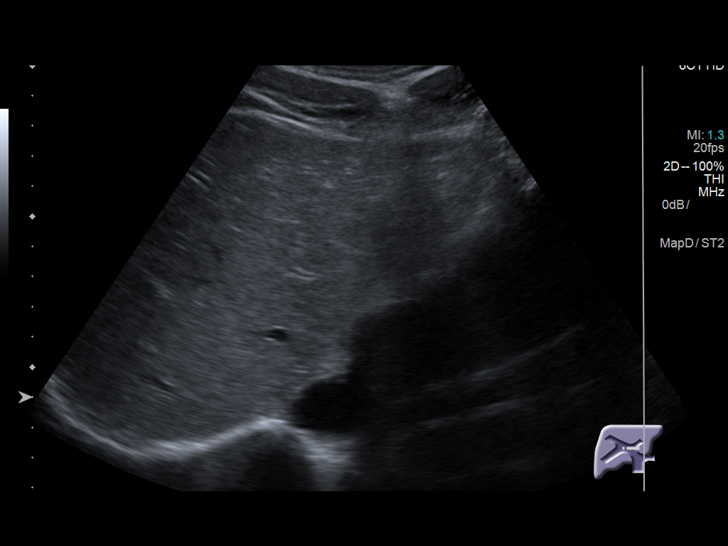
[im 22/48]
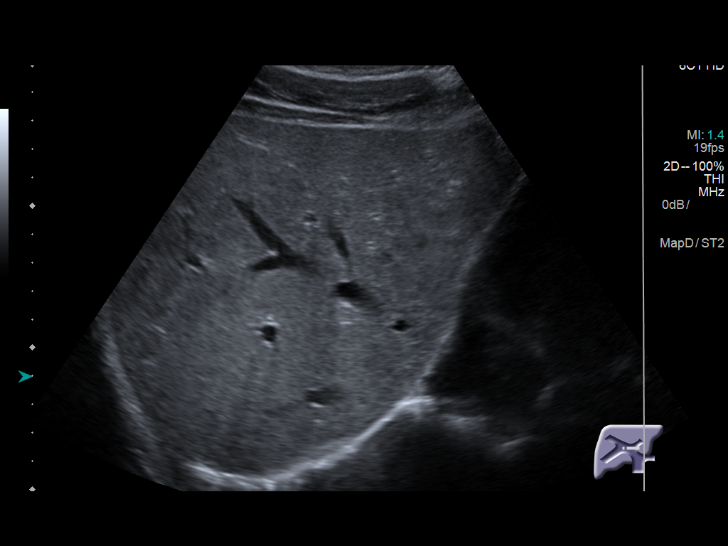
[im 26/48]
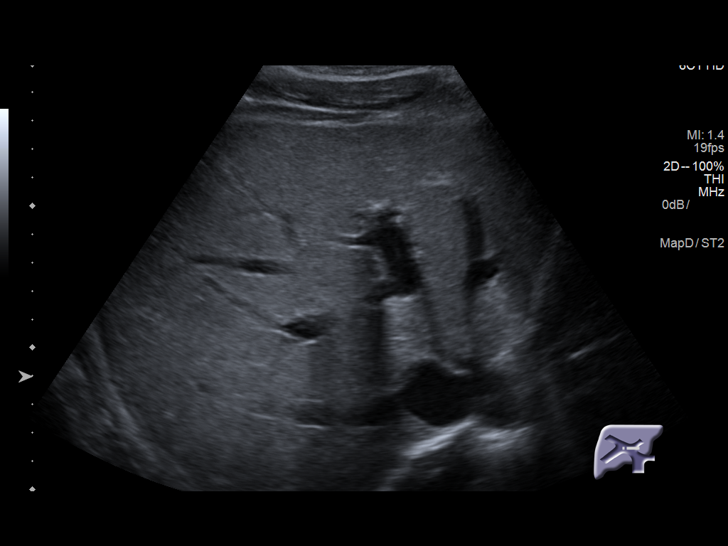
[im 30/48]
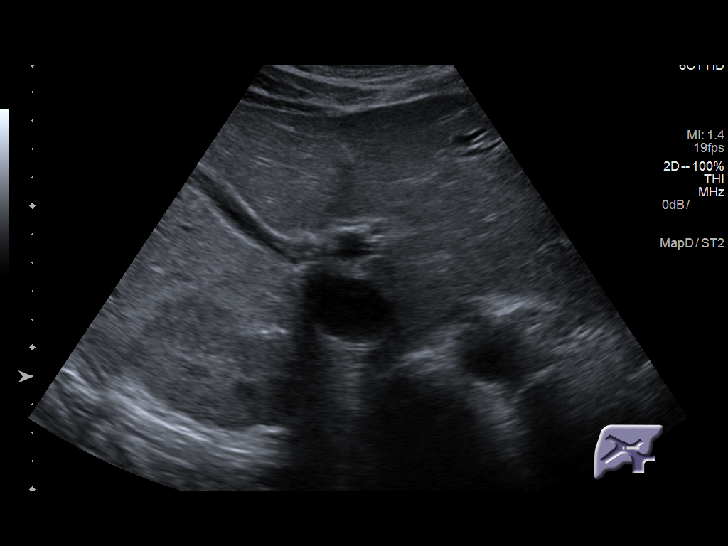
[im 32/48]
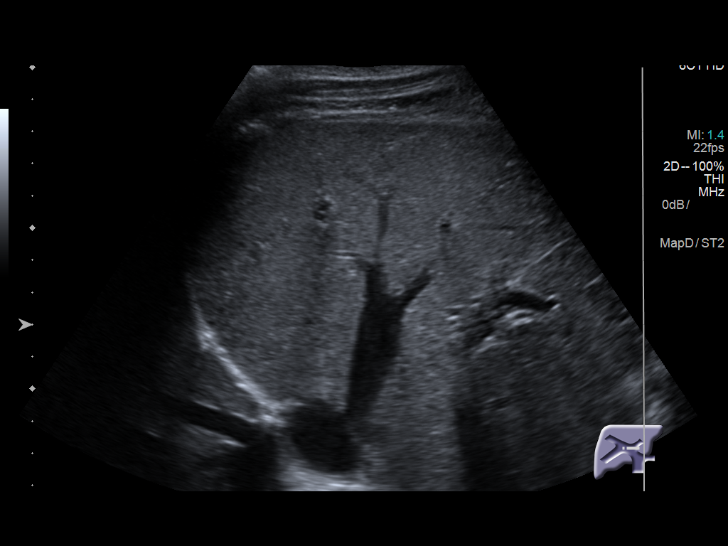
[im 36/48]
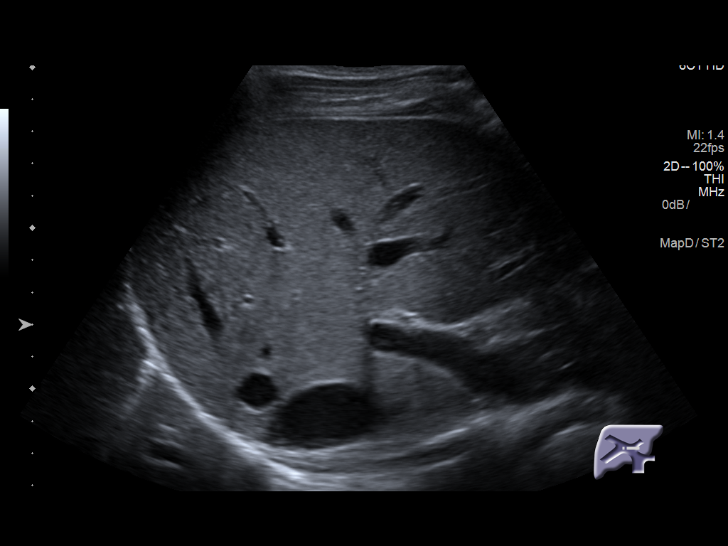
[im 40/48]
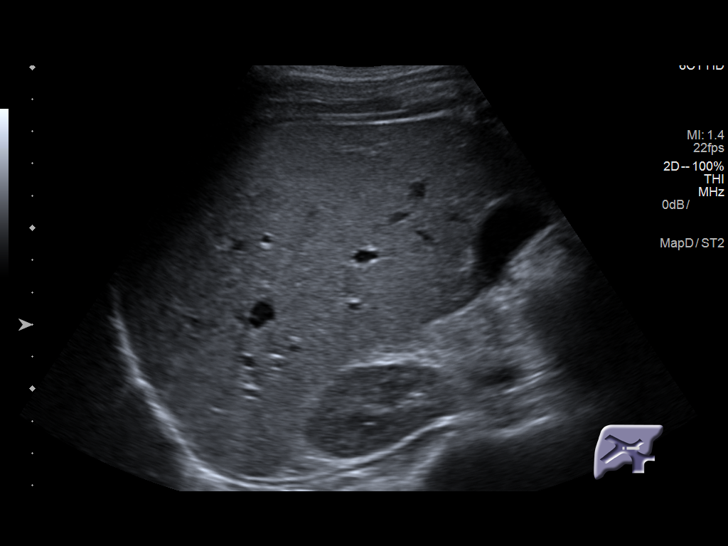
[im 44/48]
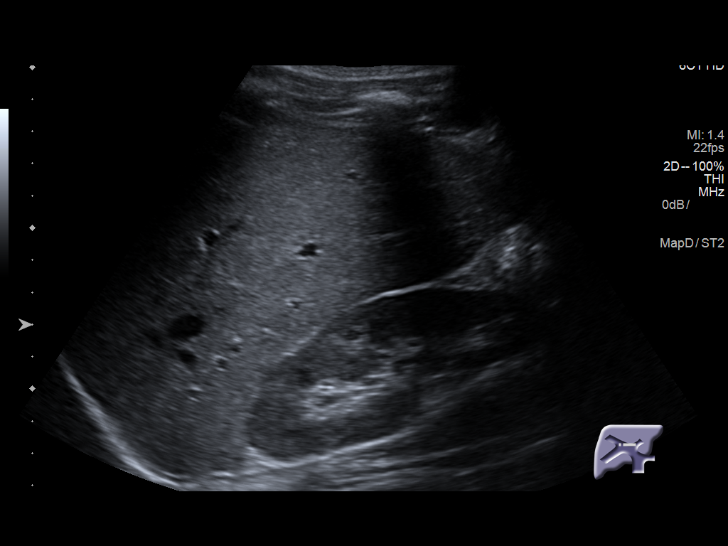
[im 48/48]
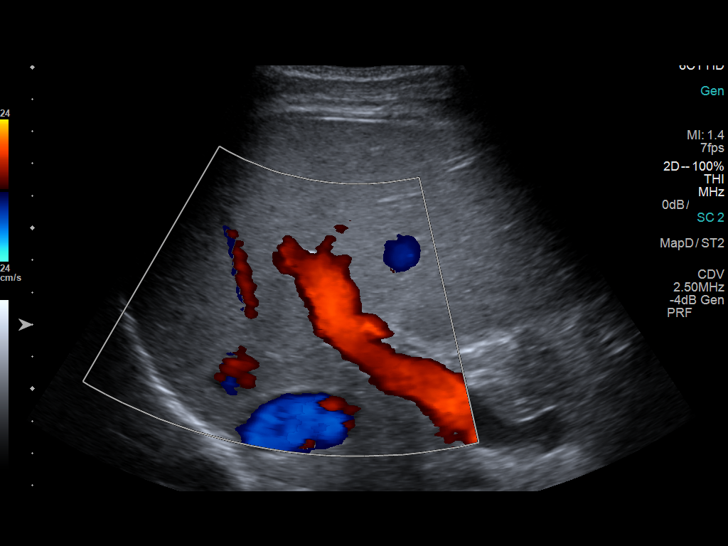

[14 of 25 positions shown; findings below may reference images not displayed]

FINDINGS: Gallbladder:

No gallstones or wall thickening visualized. No sonographic Murphy
sign noted by sonographer.

Common bile duct:

Diameter:  3 mm.

Liver:

No focal lesion identified. Within normal limits in parenchymal
echogenicity. Portal vein is patent on color Doppler imaging with
normal direction of blood flow towards the liver.
IMPRESSION: Unremarkable right upper quadrant ultrasound.

## 2019-06-04 ENCOUNTER — Telehealth (INDEPENDENT_AMBULATORY_CARE_PROVIDER_SITE_OTHER): Payer: Self-pay

## 2019-06-04 NOTE — Telephone Encounter (Signed)
He is not a pt of Dr Karilyn Cota or Maralyn Sago.

## 2019-06-04 NOTE — Telephone Encounter (Signed)
Pts Mother is calling wanting Lorazapam called in for the pt as he has lost 2 people close to him, and he is vomitting from Nerves

## 2019-06-04 NOTE — Telephone Encounter (Signed)
That is what I said also to the mother , The mother said she was a pt, and Dr Karilyn Cota had gave him something in the past

## 2019-06-04 NOTE — Telephone Encounter (Signed)
FOUND HIM IN [facility]; HE HAS NOT BEEN SEEN SINCE 08/2018. HE WILL HAVE TO BE RE ESTABLISH IN [facility].

## 2019-06-05 ENCOUNTER — Other Ambulatory Visit: Payer: Self-pay

## 2019-06-05 ENCOUNTER — Ambulatory Visit (HOSPITAL_COMMUNITY)
Admission: EM | Admit: 2019-06-05 | Discharge: 2019-06-05 | Disposition: A | Discharge status: Home / Self Care | Payer: BC Managed Care – PPO | Attending: Physician Assistant | Admitting: Physician Assistant

## 2019-06-05 ENCOUNTER — Encounter (HOSPITAL_COMMUNITY): Payer: Self-pay

## 2019-06-05 DIAGNOSIS — K219 Gastro-esophageal reflux disease without esophagitis: Secondary | ICD-10-CM

## 2019-06-05 DIAGNOSIS — R112 Nausea with vomiting, unspecified: Secondary | ICD-10-CM | POA: Diagnosis not present

## 2019-06-05 HISTORY — DX: Unspecified asthma, uncomplicated: J45.909

## 2019-06-05 MED ORDER — PANTOPRAZOLE SODIUM 20 MG PO TBEC: 40.0000 mg | DELAYED_RELEASE_TABLET | Freq: Every day | ORAL | 0 refills | Status: DC

## 2019-06-05 MED ORDER — LORAZEPAM 1 MG PO TABS: 1.0000 mg | ORAL_TABLET | Freq: Two times a day (BID) | ORAL | 0 refills | Status: AC

## 2019-06-05 MED ORDER — SUCRALFATE 1 GM/10ML PO SUSP: 1.0000 g | Freq: Three times a day (TID) | ORAL | 0 refills | Status: DC

## 2019-06-05 MED ORDER — ONDANSETRON 8 MG PO TBDP: 8.0000 mg | ORAL_TABLET | Freq: Three times a day (TID) | ORAL | 0 refills | Status: DC | PRN

## 2019-06-05 NOTE — ED Provider Notes (Signed)
[facility]    CSN: [id number] Arrival date & time: 06/05/19  1438      History   Chief Complaint Chief Complaint  Patient presents with  . Emesis    HPI Luke Lopez is a 24 y.o. male.   Patient reports to urgent care today for intractable nausea and vomiting for 3-4 days. He states this started after playing basketball on Friday or Saturday. He has constantly felt nauseous and has vomited most solid foods up since that time. He has kept some water and other liquids down. He reports no blood in the vomit. Denies abdominal pain other than soreness from vomiting. Denies diarrhea, fever, chills, headache.  He reports a similar episode last year. He had the same type of nausea and vomiting. He was treated multiple times with zofran and phenergren without much relief at the Emergency department but ultimately was referred and seen by a GI provider in [place]. Patient states it was determined these episodes were anxiety related and he was given a trial of lorazepam and started on lexapro. He reports taking the lorazepam and having immediate improvements, but did not follow through with the lexapro. He has had other episodes of vomiting since then but have not lasted this long. He reports many life stressors to include recent deaths in the family and a new job promotion which is stressful for him.      Past Medical History:  Diagnosis Date  . Asthma     Patient Active Problem List   Diagnosis Date Noted  . TENDINITIS, RIGHT HIP 01/05/2010    History reviewed. No pertinent surgical history.     Home Medications    Prior to Admission medications   Medication Sig Start Date End Date Taking? Authorizing Provider  LORazepam (ATIVAN) 1 MG tablet Take 1 tablet (1 mg total) by mouth 2 (two) times daily for 3 days. 06/05/19 06/08/19  Mishika Flippen, Veryl Speak, PA-C  ondansetron (ZOFRAN ODT) 8 MG disintegrating tablet Take 1 tablet (8 mg total) by mouth every 8 (eight)  hours as needed. 8mg  ODT q4 hours prn nausea 06/05/19   Pansy Ostrovsky, 06/07/19, PA-C  pantoprazole (PROTONIX) 20 MG tablet Take 2 tablets (40 mg total) by mouth daily. 06/05/19 07/05/19  Jaimey Franchini, 07/07/19, PA-C  potassium chloride SA (K-DUR,KLOR-CON) 20 MEQ tablet Take 1 tablet (20 mEq total) by mouth 2 (two) times daily. 08/19/18   Idol, 10/19/18, PA-C  promethazine (PHENERGAN) 25 MG suppository Place 1 suppository (25 mg total) rectally every 6 (six) hours as needed for nausea or vomiting. 08/19/18   Idol, 10/19/18, PA-C  sucralfate (CARAFATE) 1 GM/10ML suspension Take 10 mLs (1 g total) by mouth 4 (four) times daily -  with meals and at bedtime. 06/05/19   Alastor Kneale, 06/07/19, PA-C    Family History History reviewed. No pertinent family history.  Social History Social History   Tobacco Use  . Smoking status: Current Every Day Smoker    Packs/day: 0.50    Types: Cigarettes  . Smokeless tobacco: Never Used  Substance Use Topics  . Alcohol use: Yes  . Drug use: Yes    Types: Marijuana    Comment: last use monday night     Allergies   Patient has no known allergies.   Review of Systems Review of Systems  Constitutional: Negative for chills and fever.  HENT: Negative for congestion, ear pain and sore throat.   Eyes: Negative for pain and visual disturbance.  Respiratory: Negative for cough and  shortness of breath.   Cardiovascular: Negative for chest pain and palpitations.  Gastrointestinal: Positive for nausea and vomiting. Negative for abdominal pain, blood in stool and diarrhea.  Genitourinary: Negative.   Musculoskeletal: Negative for arthralgias, back pain and myalgias.  Skin: Negative for color change and rash.  Neurological: Negative for seizures, syncope, facial asymmetry and headaches.  Hematological: Negative.   All other systems reviewed and are negative.    Physical Exam Triage Vital Signs ED Triage Vitals  Enc Vitals Group     BP 06/05/19 1520 119/64     Pulse Rate 06/05/19 1520 64      Resp 06/05/19 1520 18     Temp 06/05/19 1520 98.3 F (36.8 C)     Temp Source 06/05/19 1520 Oral     SpO2 06/05/19 1520 100 %     Weight 06/05/19 1518 155 lb (70.3 kg)     Height --      Head Circumference --      Peak Flow --      Pain Score 06/05/19 1518 5     Pain Loc --      Pain Edu? --      Excl. in GC? --    No data found.  Updated Vital Signs BP 119/64 (BP Location: Right Arm)   Pulse 64   Temp 98.3 F (36.8 C) (Oral)   Resp 18   Wt 155 lb (70.3 kg)   SpO2 100%   BMI 22.24 kg/m   Visual Acuity Right Eye Distance:   Left Eye Distance:   Bilateral Distance:    Right Eye Near:   Left Eye Near:    Bilateral Near:     Physical Exam Vitals and nursing note reviewed.  Constitutional:      Appearance: He is well-developed and normal weight. He is ill-appearing.  HENT:     Head: Normocephalic and atraumatic.  Eyes:     Conjunctiva/sclera: Conjunctivae normal.  Cardiovascular:     Rate and Rhythm: Normal rate and regular rhythm.     Heart sounds: No murmur. No friction rub. No gallop.   Pulmonary:     Effort: Pulmonary effort is normal. No respiratory distress.     Breath sounds: Normal breath sounds. No stridor. No wheezing, rhonchi or rales.  Abdominal:     Palpations: Abdomen is soft.     Tenderness: There is no abdominal tenderness.  Musculoskeletal:     Cervical back: Neck supple.  Skin:    General: Skin is warm and dry.     Coloration: Skin is not jaundiced or pale.     Findings: No rash.  Neurological:     General: No focal deficit present.     Mental Status: He is alert and oriented to person, place, and time.  Psychiatric:        Mood and Affect: Mood normal.        Behavior: Behavior normal.        Thought Content: Thought content normal.        Judgment: Judgment normal.      UC Treatments / Results  Labs (all labs ordered are listed, but only abnormal results are displayed) Labs Reviewed - No data to display  EKG   Radiology  No results found.  Procedures Procedures (including critical care time)  Medications Ordered in UC Medications - No data to display  Initial Impression / Assessment and Plan / UC Course  I have reviewed the triage vital signs and  the nursing notes.  Pertinent labs & imaging results that were available during my care of the patient were reviewed by me and considered in my medical decision making (see chart for details).    #Intractable N/V - PDMP verifies lorazepman given by a provider in the time frame discussed. No notes as this was an outside facility. Discussed need to follow up with that GI provider and then contact the provided clinic in [place]. Sent for 3 days of lorazepam as this seems to have been the only medication to break his previous cycle. Felt the benefits were greater than the risk. Treating for GERD as well with pantoprazole and elevating head of bed. ED precautions given. Patient agrees that he will follow up and follow through with the GI providers   Final Clinical Impressions(s) / UC Diagnoses   Final diagnoses:  Intractable vomiting with nausea, unspecified vomiting type  Gastroesophageal reflux disease, unspecified whether esophagitis present     Discharge Instructions     Take the lorazepam 2 times a day for 3 days. Do not drink alcohol or drive with this medication  Take the pantoprazole daily, 2 tablets. Elevate the head of your bed. Do not eat right before laying down for bed.  Use the zofran every 8 hours.  Drink water and pedialyte Eat small bland meals, such as toast or crackers  Call the GI office I have given you and your previous GI office to discuss further evaluation.  I have requested a Primary Care assistance for you, look for this phone call.  If you begin to vomit blood, feel very faint or light headed, please go to the Emergency Department      ED Prescriptions    Medication Sig Dispense Auth. Provider   ondansetron (ZOFRAN  ODT) 8 MG disintegrating tablet  (Status: Discontinued) Take 1 tablet (8 mg total) by mouth every 8 (eight) hours as needed. 8mg  ODT q4 hours prn nausea 12 tablet Denney Shein, Marguerita Beards, PA-C   pantoprazole (PROTONIX) 20 MG tablet Take 2 tablets (40 mg total) by mouth daily. 60 tablet Sayyid Harewood, Marguerita Beards, PA-C   sucralfate (CARAFATE) 1 GM/10ML suspension Take 10 mLs (1 g total) by mouth 4 (four) times daily -  with meals and at bedtime. 420 mL [employer] Null, Marguerita Beards, PA-C   LORazepam (ATIVAN) 1 MG tablet Take 1 tablet (1 mg total) by mouth 2 (two) times daily for 3 days. 6 tablet Trampus Mcquerry, Marguerita Beards, PA-C   ondansetron (ZOFRAN ODT) 8 MG disintegrating tablet Take 1 tablet (8 mg total) by mouth every 8 (eight) hours as needed. 8mg  ODT q4 hours prn nausea 12 tablet Shauntee Karp, Marguerita Beards, PA-C     I have reviewed the PDMP during this encounter.   Purnell Shoemaker, PA-C 06/05/19 1628

## 2019-06-05 NOTE — Discharge Instructions (Addendum)
Take the lorazepam 2 times a day for 3 days. Do not drink alcohol or drive with this medication  Take the pantoprazole daily, 2 tablets. Elevate the head of your bed. Do not eat right before laying down for bed.  Use the zofran every 8 hours.  Drink water and pedialyte Eat small bland meals, such as toast or crackers  Call the GI office I have given you and your previous GI office to discuss further evaluation.  I have requested a Primary Care assistance for you, look for this phone call.  If you begin to vomit blood, feel very faint or light headed, please go to the Emergency Department

## 2019-06-05 NOTE — ED Triage Notes (Signed)
Pt states she has been vomiting off and on for 5 days . Pt state this is a on going issues. Pt states he has been seen by a [place] doctor for this issue.

## 2019-06-14 ENCOUNTER — Ambulatory Visit: Payer: BC Managed Care – PPO | Admitting: Nurse Practitioner

## 2019-06-26 ENCOUNTER — Ambulatory Visit: Payer: BC Managed Care – PPO | Admitting: Nurse Practitioner

## 2019-06-26 ENCOUNTER — Encounter: Payer: Self-pay | Admitting: Nurse Practitioner

## 2019-06-26 ENCOUNTER — Other Ambulatory Visit (INDEPENDENT_AMBULATORY_CARE_PROVIDER_SITE_OTHER): Payer: BC Managed Care – PPO

## 2019-06-26 ENCOUNTER — Other Ambulatory Visit: Payer: Self-pay

## 2019-06-26 VITALS — BP 90/60 | HR 73 | Temp 98.3°F | Ht 70.0 in | Wt 166.0 lb

## 2019-06-26 DIAGNOSIS — R7989 Other specified abnormal findings of blood chemistry: Secondary | ICD-10-CM

## 2019-06-26 DIAGNOSIS — R945 Abnormal results of liver function studies: Secondary | ICD-10-CM

## 2019-06-26 DIAGNOSIS — R112 Nausea with vomiting, unspecified: Secondary | ICD-10-CM | POA: Diagnosis not present

## 2019-06-26 LAB — HEPATIC FUNCTION PANEL
ALT: 10 U/L (ref 0–53)
AST: 14 U/L (ref 0–37)
Albumin: 4.8 g/dL (ref 3.5–5.2)
Alkaline Phosphatase: 35 U/L — ABNORMAL LOW (ref 39–117)
Bilirubin, Direct: 0.1 mg/dL (ref 0.0–0.3)
Total Bilirubin: 0.5 mg/dL (ref 0.2–1.2)
Total Protein: 7.2 g/dL (ref 6.0–8.3)

## 2019-06-26 NOTE — Progress Notes (Signed)
ASSESSMENT / PLAN:   # Chronic episodic nausea / vomiting --Started around age 24 at which time he was drinking a lot of alcohol.. Subsequent 3 episodes over last few years have been in the absence of significant EtOH use but frequent marijuana use.  Multiple ED visits with negative basic workup.  --He feels completely fine in between these episodes. It makes sense to alleviate marijuana / etoh while trying to diagnose problem and he agrees to do this. Marijuana causes nausea / vomiting in many patients.  He will call for any recurrent nausea / vomiting in absence of etoh or marijuana and we can arrange for EGD to rule out upper GI pathology. --TCAs can be helpful in cyclic vomiting but further workup would be needed first, assuming elimination of marijuana doesn't prove helpful   # Elevated total bilirubin, minimal --repeat liver tests today, including fractionated bilirubin  HPI:     Chief Complaint:   Nausea and vomiting  Luke Lopez is a 24 y.o. male with a pmh significant for asthma . He has had several visits to the local emergency department for nausea and vomiting over the last couple of years. Prior to that he had been seen at other hospitals for same problems.  The nausea and vomiting started when he was about 24 years old while on a beach trip and consuming a lot of alcohol.  Around that time he was hospitalized at an outside facility, told he could have some liver problems . Overall since symptoms started, he has had about 4 episodes of intractable nausea and vomiting, three of which have been over the last two years for which he was evaluated at [facility] ED. He was seen in the ED January 2019, April 2020 and again this past January. Episodes last for about a week.  He sometimes has associated diarrhea and weight loss. In between episodes he regains weight and feels completely back to normal, just like today.  Work-up has been basically unremarkable except  for electrolyte abnormality secondary to vomiting.  White count has been normal.  He has not been shown to be anemic.  Lipase normal.  Trivial elevation in total bilirubin on a couple of occasions, liver test otherwise normal.   Aragon smokes marijuana on regular basis.  He has noticed that soaking in the bath helps the nausea.Marland Kitchen  His not a big consumer of alcohol, prefers marijuana as it helps him relax and manage anxiety.  He has noted some correlation between anxiety and vomiting.  He does not take NSAIDs.  No hematemesis or blood in stool.  Data Reviewed:   08/19/18  K+ 2.9, Bili 1.4, remaining liver tests normmal, lipase normal. CBC normal.  08/13/2018 acute abdominal series unremarkable 05/16/2017 RUQ ultrasound unremarkable   Past Medical History:  Diagnosis Date  . Asthma      History reviewed. No pertinent surgical history. Family History  Problem Relation Age of Onset  . Irritable bowel syndrome Maternal Grandmother   . Breast cancer Paternal Aunt    Social History   Tobacco Use  . Smoking status: Current Every Day Smoker    Packs/day: 0.50    Types: Cigarettes  . Smokeless tobacco: Never Used  Substance Use Topics  . Alcohol use: Yes    Comment: occasional  . Drug use: Yes    Types: Marijuana    Comment: last use monday night   No  current outpatient medications on file.   No current facility-administered medications for this visit.   No Known Allergies   Review of Systems: All systems reviewed and negative except where noted in HPI.   Creatinine clearance cannot be calculated (Patient's most recent lab result is older than the maximum 21 days allowed.)   Physical Exam:    Wt Readings from Last 3 Encounters:  06/26/19 166 lb (75.3 kg)  06/05/19 155 lb (70.3 kg)  08/19/18 155 lb (70.3 kg)    BP 90/60   Pulse 73   Temp 98.3 F (36.8 C)   Ht 5\' 10"  (1.778 m)   Wt 166 lb (75.3 kg)   BMI 23.82 kg/m  Constitutional:  Pleasant male in no acute  distress. Psychiatric: Normal mood and affect. Behavior is normal. EENT: Pupils normal.  Conjunctivae are normal. No scleral icterus. Neck supple.  Cardiovascular: Normal rate, regular rhythm. No edema Pulmonary/chest: Effort normal and breath sounds normal. No wheezing, rales or rhonchi. Abdominal: Soft, nondistended, nontender. Bowel sounds active throughout. There are no masses palpable. No hepatomegaly. Neurological: Alert and oriented to person place and time. Skin: Skin is warm and dry. No rashes noted.Generalized tattoos  I spent 40 minutes total reviewing records, obtaining history, performing exam, counseling patient and documenting visit / findings.   Tye Savoy, NP  06/26/2019, 4:02 PM

## 2019-06-26 NOTE — Patient Instructions (Signed)
If you are age 23 or older, your body mass index should be between 23-30. Your Body mass index is 23.82 kg/m. If this is out of the aforementioned range listed, please consider follow up with your Primary Care Provider.  If you are age 68 or younger, your body mass index should be between 19-25. Your Body mass index is 23.82 kg/m. If this is out of the aformentioned range listed, please consider follow up with your Primary Care Provider.   Your provider has requested that you go to the basement level for lab work before leaving today. Press "B" on the elevator. The lab is located at the first door on the left as you exit the elevator.  Due to recent changes in healthcare laws, you may see the results of your imaging and laboratory studies on MyChart before your provider has had a chance to review them.  We understand that in some cases there may be results that are confusing or concerning to you. Not all laboratory results come back in the same time frame and the provider may be waiting for multiple results in order to interpret others.  Please give [place] 48 hours in order for your provider to thoroughly review all the results before contacting the office for clarification of your results.

## 2019-06-27 LAB — BILIRUBIN, FRACTIONATED(TOT/DIR/INDIR)
Bilirubin, Direct: 0.1 mg/dL (ref 0.0–0.2)
Indirect Bilirubin: 0.4 mg/dL (calc) (ref 0.2–1.2)
Total Bilirubin: 0.5 mg/dL (ref 0.2–1.2)

## 2019-06-28 ENCOUNTER — Ambulatory Visit: Payer: BC Managed Care – PPO | Admitting: Nurse Practitioner

## 2019-06-28 NOTE — Progress Notes (Signed)
Reviewed and agree with documentation and assessment and plan. K. Veena Shadavia Dampier , MD   

## 2019-09-21 ENCOUNTER — Emergency Department (HOSPITAL_COMMUNITY)
Admission: EM | Admit: 2019-09-21 | Discharge: 2019-09-21 | Disposition: A | Discharge status: Home / Self Care | Payer: BC Managed Care – PPO | Attending: Emergency Medicine | Admitting: Emergency Medicine

## 2019-09-21 ENCOUNTER — Encounter (HOSPITAL_COMMUNITY): Payer: Self-pay

## 2019-09-21 ENCOUNTER — Other Ambulatory Visit: Payer: Self-pay

## 2019-09-21 DIAGNOSIS — F1210 Cannabis abuse, uncomplicated: Secondary | ICD-10-CM | POA: Diagnosis not present

## 2019-09-21 DIAGNOSIS — F17210 Nicotine dependence, cigarettes, uncomplicated: Secondary | ICD-10-CM | POA: Insufficient documentation

## 2019-09-21 DIAGNOSIS — J45909 Unspecified asthma, uncomplicated: Secondary | ICD-10-CM | POA: Insufficient documentation

## 2019-09-21 DIAGNOSIS — E860 Dehydration: Secondary | ICD-10-CM | POA: Diagnosis not present

## 2019-09-21 DIAGNOSIS — R112 Nausea with vomiting, unspecified: Secondary | ICD-10-CM | POA: Diagnosis not present

## 2019-09-21 DIAGNOSIS — R197 Diarrhea, unspecified: Secondary | ICD-10-CM | POA: Diagnosis not present

## 2019-09-21 LAB — CBC
HCT: 48.4 % (ref 39.0–52.0)
Hemoglobin: 16.7 g/dL (ref 13.0–17.0)
MCH: 28.1 pg (ref 26.0–34.0)
MCHC: 34.5 g/dL (ref 30.0–36.0)
MCV: 81.3 fL (ref 80.0–100.0)
Platelets: 289 10*3/uL (ref 150–400)
RBC: 5.95 MIL/uL — ABNORMAL HIGH (ref 4.22–5.81)
RDW: 12 % (ref 11.5–15.5)
WBC: 10.8 10*3/uL — ABNORMAL HIGH (ref 4.0–10.5)
nRBC: 0 % (ref 0.0–0.2)

## 2019-09-21 LAB — COMPREHENSIVE METABOLIC PANEL
ALT: 17 U/L (ref 0–44)
AST: 24 U/L (ref 15–41)
Albumin: 5 g/dL (ref 3.5–5.0)
Alkaline Phosphatase: 38 U/L (ref 38–126)
Anion gap: 15 (ref 5–15)
BUN: 24 mg/dL — ABNORMAL HIGH (ref 6–20)
CO2: 27 mmol/L (ref 22–32)
Calcium: 9.9 mg/dL (ref 8.9–10.3)
Chloride: 91 mmol/L — ABNORMAL LOW (ref 98–111)
Creatinine, Ser: 1.29 mg/dL — ABNORMAL HIGH (ref 0.61–1.24)
GFR calc Af Amer: >60 mL/min (ref >60)
GFR calc non Af Amer: >60 mL/min (ref >60)
Glucose, Bld: 109 mg/dL — ABNORMAL HIGH (ref 70–99)
Potassium: 3 mmol/L — ABNORMAL LOW (ref 3.5–5.1)
Sodium: 133 mmol/L — ABNORMAL LOW (ref 135–145)
Total Bilirubin: 1.7 mg/dL — ABNORMAL HIGH (ref 0.3–1.2)
Total Protein: 8.2 g/dL — ABNORMAL HIGH (ref 6.5–8.1)

## 2019-09-21 LAB — LIPASE, BLOOD: Lipase: 28 U/L (ref 11–51)

## 2019-09-21 MED ORDER — ONDANSETRON 4 MG PO TBDP
4.0000 mg | ORAL_TABLET | Freq: Once | ORAL | Status: AC | PRN
  Administered 2019-09-21: 4 mg via ORAL
  Filled 2019-09-21: qty 1

## 2019-09-21 MED ORDER — SODIUM CHLORIDE 0.9% FLUSH
3.0000 mL | Freq: Once | INTRAVENOUS | Status: AC
  Administered 2019-09-21: 3 mL via INTRAVENOUS

## 2019-09-21 MED ORDER — ONDANSETRON HCL 4 MG PO TABS: 4.0000 mg | ORAL_TABLET | Freq: Three times a day (TID) | ORAL | 0 refills | Status: DC | PRN

## 2019-09-21 MED ORDER — ONDANSETRON HCL 4 MG/2ML IJ SOLN
4.0000 mg | Freq: Once | INTRAMUSCULAR | Status: AC
  Administered 2019-09-21: 4 mg via INTRAVENOUS
  Filled 2019-09-21: qty 2

## 2019-09-21 MED ORDER — SODIUM CHLORIDE 0.9 % IV BOLUS
1000.0000 mL | Freq: Once | INTRAVENOUS | Status: AC
  Administered 2019-09-21: 1000 mL via INTRAVENOUS

## 2019-09-21 NOTE — ED Triage Notes (Signed)
Pt arrives to ED w/ c/ N/V Tuesday morning. Pt states this has been on off again and on again issue for the last two years. Pt saw a GI MD and was told that this was related to his marijuana use. Pt states that he has not stopped smoking since that time. Pt reports on Monday night he ate a large dinner and drank a lot of alcohol on Monday night and has been sick since.

## 2019-09-21 NOTE — Discharge Instructions (Signed)
Avoid marijuana use and alcohol use as it will likely worsening your symptoms.  Take zofran as needed for nausea.  Stay hydrated.

## 2019-09-21 NOTE — ED Provider Notes (Signed)
[facility] EMERGENCY DEPARTMENT Provider Note   CSN: [id number] Arrival date & time: 09/21/19  1105     History Chief Complaint  Patient presents with  . Nausea    Luke Lopez is a 24 y.o. male.  The history is provided by the patient and medical records. No language interpreter was used.     24 year old male with history of alcohol abuse as well as marijuana abuse, presenting complaining of nausea or vomiting.  Patient report for the past 4 days he has had persistent nausea, and vomiting of nonbloody nonbilious content.  He also endorsed some loose stools initially but minimal stool recently.  He feels hungry but afraid to eat.  He does not complain of any fever, chills lightheadedness dizziness chest pain shortness of breath abdominal pain or dysuria.  He admits to drinking moderate amount of alcohol at the onset of his symptoms.  He also admits to using marijuana last use was yesterday.  He denies any active pain at this time.  Denies any recent sick contact.  Past Medical History:  Diagnosis Date  . Asthma     Patient Active Problem List   Diagnosis Date Noted  . TENDINITIS, RIGHT HIP 01/05/2010    History reviewed. No pertinent surgical history.     Family History  Problem Relation Age of Onset  . Irritable bowel syndrome Maternal Grandmother   . Breast cancer Paternal Aunt     Social History   Tobacco Use  . Smoking status: Current Every Day Smoker    Packs/day: 0.50    Types: Cigarettes  . Smokeless tobacco: Never Used  Substance Use Topics  . Alcohol use: Yes    Comment: occasional  . Drug use: Yes    Types: Marijuana    Comment: last use monday night    Home Medications Prior to Admission medications   Not on File    Allergies    Patient has no known allergies.  Review of Systems   Review of Systems  All other systems reviewed and are negative.   Physical Exam Updated Vital Signs BP 121/65 (BP Location:  Right Arm)   Pulse (!) 53   Temp 98.3 F (36.8 C) (Oral)   Resp 16   SpO2 100%   Physical Exam Vitals and nursing note reviewed.  Constitutional:      General: He is not in acute distress.    Appearance: He is well-developed.  HENT:     Head: Atraumatic.  Eyes:     Conjunctiva/sclera: Conjunctivae normal.  Cardiovascular:     Rate and Rhythm: Normal rate and regular rhythm.     Pulses: Normal pulses.     Heart sounds: Normal heart sounds.  Pulmonary:     Effort: Pulmonary effort is normal.     Breath sounds: Normal breath sounds.  Abdominal:     Palpations: Abdomen is soft.     Tenderness: There is no abdominal tenderness.  Musculoskeletal:     Cervical back: Neck supple.  Skin:    Findings: No rash.  Neurological:     Mental Status: He is alert.     ED Results / Procedures / Treatments   Labs (all labs ordered are listed, but only abnormal results are displayed) Labs Reviewed  COMPREHENSIVE METABOLIC PANEL - Abnormal; Notable for the following components:      Result Value   Sodium 133 (*)    Potassium 3.0 (*)    Chloride 91 (*)  Glucose, Bld 109 (*)    BUN 24 (*)    Creatinine, Ser 1.29 (*)    Total Protein 8.2 (*)    Total Bilirubin 1.7 (*)    All other components within normal limits  CBC - Abnormal; Notable for the following components:   WBC 10.8 (*)    RBC 5.95 (*)    All other components within normal limits  LIPASE, BLOOD  URINALYSIS, ROUTINE W REFLEX MICROSCOPIC    EKG None  Radiology No results found.  Procedures Procedures (including critical care time)  Medications Ordered in ED Medications  sodium chloride flush (NS) 0.9 % injection 3 mL (3 mLs Intravenous Given 09/21/19 1805)  ondansetron (ZOFRAN-ODT) disintegrating tablet 4 mg (4 mg Oral Given 09/21/19 1133)  sodium chloride 0.9 % bolus 1,000 mL (1,000 mLs Intravenous New Bag/Given 09/21/19 1805)  ondansetron (ZOFRAN) injection 4 mg (4 mg Intravenous Given 09/21/19 1805)    ED  Course  I have reviewed the triage vital signs and the nursing notes.  Pertinent labs & imaging results that were available during my care of the patient were reviewed by me and considered in my medical decision making (see chart for details).    MDM Rules/Calculators/A&P                      BP 121/65 (BP Location: Right Arm)   Pulse (!) 53   Temp 98.3 F (36.8 C) (Oral)   Resp 16   SpO2 100%   Final Clinical Impression(s) / ED Diagnoses Final diagnoses:  Dehydration  Non-intractable vomiting with nausea, unspecified vomiting type    Rx / DC Orders ED Discharge Orders         Ordered    ondansetron (ZOFRAN) 4 MG tablet  Every 8 hours PRN     09/21/19 1917         Patient with history of alcohol abuse as well as marijuana abuse here with nausea vomiting and some loose stools for the past few days after heavy drinking.  Patient was found to be dehydrated.  Symptoms did improve with IV fluid and now he is able to tolerate p.o.  Some component of cannabinol hyperemesis syndrome.  Encourage patient to avoid alcohol and marijuana use as it may worsen his symptoms.  Otherwise he is stable for discharge.  He has a fairly benign abdominal exam.   Fayrene Helper, PA-C 09/21/19 Othella Boyer, MD 09/21/19 [phone]

## 2019-10-16 DIAGNOSIS — B360 Pityriasis versicolor: Secondary | ICD-10-CM | POA: Diagnosis not present

## 2020-02-29 DIAGNOSIS — R112 Nausea with vomiting, unspecified: Secondary | ICD-10-CM | POA: Diagnosis not present

## 2020-02-29 DIAGNOSIS — R001 Bradycardia, unspecified: Secondary | ICD-10-CM | POA: Diagnosis not present

## 2020-02-29 DIAGNOSIS — Z6823 Body mass index (BMI) 23.0-23.9, adult: Secondary | ICD-10-CM | POA: Diagnosis not present

## 2020-04-16 ENCOUNTER — Encounter (INDEPENDENT_AMBULATORY_CARE_PROVIDER_SITE_OTHER): Payer: Self-pay | Admitting: Internal Medicine

## 2020-04-16 ENCOUNTER — Other Ambulatory Visit: Payer: Self-pay

## 2020-04-16 ENCOUNTER — Ambulatory Visit (INDEPENDENT_AMBULATORY_CARE_PROVIDER_SITE_OTHER): Payer: BC Managed Care – PPO | Admitting: Internal Medicine

## 2020-04-16 VITALS — BP 112/68 | HR 80 | Temp 97.1°F | Ht 68.5 in | Wt 156.6 lb

## 2020-04-16 DIAGNOSIS — R112 Nausea with vomiting, unspecified: Secondary | ICD-10-CM

## 2020-04-16 DIAGNOSIS — F419 Anxiety disorder, unspecified: Secondary | ICD-10-CM | POA: Diagnosis not present

## 2020-04-16 DIAGNOSIS — R5383 Other fatigue: Secondary | ICD-10-CM | POA: Diagnosis not present

## 2020-04-16 DIAGNOSIS — R5381 Other malaise: Secondary | ICD-10-CM | POA: Diagnosis not present

## 2020-04-16 DIAGNOSIS — E559 Vitamin D deficiency, unspecified: Secondary | ICD-10-CM | POA: Diagnosis not present

## 2020-04-16 NOTE — Progress Notes (Signed)
Metrics: Intervention Frequency ACO  Documented Smoking Status Yearly  Screened one or more times in 24 months  Cessation Counseling or  Active cessation medication Past 24 months  Past 24 months   Guideline developer: UpToDate (See UpToDate for funding source) Date Released: 2014       Wellness Office Visit  Subjective:  Patient ID: Luke Lopez, male    DOB: 25-Jan-1996  Age: 24 y.o. MRN: [id number]  CC: This man comes to our practice after long hiatus of more than 1 year.  His main complaint is cyclical nausea and vomiting.  He has been evaluated by gastroenterology in February of this year. HPI  The evaluation by the gastroenterologist consisted of agreeing for him to discontinue use of marijuana and alcohol.  He tells me he has not been using marijuana and alcohol for the last 3 weeks or so.  Since this time, he has had no episodes of nausea vomiting but this is not unusual.  He says that the episodes can come on cyclically.  Based on the records that I am seeing, they tend to occur after he had ingested alcohol and/or marijuana. He also describes a lot of stress in his life and appears to have some sort of PTSD from the death of a step sibling and also a close friend approximately 18 months ago. He describes fatigue intermittently.  He is trying to make better lifestyle choices for himself.  He is managing to hold down a job. Past Medical History:  Diagnosis Date  . Asthma    History reviewed. No pertinent surgical history.   Family History  Problem Relation Age of Onset  . Irritable bowel syndrome Maternal Grandmother   . Breast cancer Paternal Aunt     Social History   Social History Narrative   Lives with girlfriend and girlfriend's mum.Works at [employer].   Social History   Tobacco Use  . Smoking status: Former Smoker    Packs/day: 0.50    Types: Cigarettes  . Smokeless tobacco: Never Used  Substance Use Topics  . Alcohol use: Yes     Comment: occasional    No outpatient medications have been marked as taking for the 04/16/20 encounter (Office Visit) with Wilson Singer, MD.      Depression screen [facility] 2/9 04/16/2020  Decreased Interest 0  Down, Depressed, Hopeless 1  PHQ - 2 Score 1  Altered sleeping 0  Tired, decreased energy 1  Change in appetite 0  Feeling bad or failure about yourself  0  Trouble concentrating 0  Moving slowly or fidgety/restless 0  Suicidal thoughts 0  PHQ-9 Score 2  Difficult doing work/chores Not difficult at all     Objective:   Today's Vitals: BP 112/68   Pulse 80   Temp (!) 97.1 F (36.2 C) (Temporal)   Ht 5' 8.5" (1.74 m)   Wt 156 lb 9.6 oz (71 kg)   SpO2 99%   BMI 23.46 kg/m  Vitals with BMI 04/16/2020 09/21/2019 09/21/2019  Height 5' 8.5" - -  Weight 156 lbs 10 oz - -  BMI 23.46 - -  Systolic 112 98 121  Diastolic 68 59 65  Pulse 80 57 53     Physical Exam   He physically looks well.  He does not look cachectic.  He is afebrile and blood pressure is well controlled.    Assessment   1. Nausea and vomiting, intractability of vomiting not specified, unspecified vomiting type   2.  Anxiety   3. Malaise and fatigue   4. Vitamin D deficiency disease       Tests ordered Orders Placed This Encounter  Procedures  . CBC  . COMPLETE METABOLIC PANEL WITH GFR  . T3, free  . T4, free  . TSH  . VITAMIN D 25 Hydroxy (Vit-D Deficiency, Fractures)     Plan: 1. Blood work is ordered.  I am not sure the entire nature of his symptoms but it is probably multifactorial including psychological issues.  We will see if the blood work shows anything abnormal.  I will let him know and we will go from there.   No orders of the defined types were placed in this encounter.   Wilson Singer, MD

## 2020-04-17 LAB — TSH: TSH: 1.57 mIU/L (ref 0.40–4.50)

## 2020-04-17 LAB — CBC
HCT: 38.2 % — ABNORMAL LOW (ref 38.5–50.0)
Hemoglobin: 13.2 g/dL (ref 13.2–17.1)
MCH: 28.9 pg (ref 27.0–33.0)
MCHC: 34.6 g/dL (ref 32.0–36.0)
MCV: 83.8 fL (ref 80.0–100.0)
MPV: 11.5 fL (ref 7.5–12.5)
Platelets: 272 10*3/uL (ref 140–400)
RBC: 4.56 10*6/uL (ref 4.20–5.80)
RDW: 12.6 % (ref 11.0–15.0)
WBC: 5.6 10*3/uL (ref 3.8–10.8)

## 2020-04-17 LAB — COMPLETE METABOLIC PANEL WITH GFR
AG Ratio: 2.2 (calc) (ref 1.0–2.5)
ALT: 9 U/L (ref 9–46)
AST: 14 U/L (ref 10–40)
Albumin: 4.6 g/dL (ref 3.6–5.1)
Alkaline phosphatase (APISO): 44 U/L (ref 36–130)
BUN: 13 mg/dL (ref 7–25)
CO2: 30 mmol/L (ref 20–32)
Calcium: 9.4 mg/dL (ref 8.6–10.3)
Chloride: 103 mmol/L (ref 98–110)
Creat: 1.14 mg/dL (ref 0.60–1.35)
GFR, Est African American: 104 mL/min/{1.73_m2} (ref >=60)
GFR, Est Non African American: 90 mL/min/{1.73_m2} (ref >=60)
Globulin: 2.1 g/dL (calc) (ref 1.9–3.7)
Glucose, Bld: 89 mg/dL (ref 65–139)
Potassium: 4.3 mmol/L (ref 3.5–5.3)
Sodium: 138 mmol/L (ref 135–146)
Total Bilirubin: 0.3 mg/dL (ref 0.2–1.2)
Total Protein: 6.7 g/dL (ref 6.1–8.1)

## 2020-04-17 LAB — VITAMIN D 25 HYDROXY (VIT D DEFICIENCY, FRACTURES): Vit D, 25-Hydroxy: 15 ng/mL — ABNORMAL LOW (ref 30–100)

## 2020-04-17 LAB — T4, FREE: Free T4: 1.2 ng/dL (ref 0.8–1.8)

## 2020-04-17 LAB — T3, FREE: T3, Free: 3.3 pg/mL (ref 2.3–4.2)

## 2020-04-17 NOTE — Progress Notes (Signed)
Patient called.  No answer, called mom Amy who is on chart as a person to give messages to for anything. Pt instructions was given for per Dr Karilyn Cota:   He needs to start taking vitamin D3 10,000 units daily which he can get from the pharmacy over-the-counter.  He can purchase a bottle in which each pill is vitamin D3 5000 units and then he will take 2 pills every day.  Make him a follow-up appointment to see me in about 4 to 6 weeks.

## 2020-04-17 NOTE — Progress Notes (Signed)
Please call this patient.  Tell him his blood work is normal except that his vitamin D levels are extremely low.  He needs to start taking vitamin D3 10,000 units daily which he can get from the pharmacy over-the-counter.  He can purchase a bottle in which each pill is vitamin D3 5000 units and then he will take 2 pills every day.  Make him a follow-up appointment to see me in about 4 to 6 weeks.  Thanks.

## 2020-09-14 IMAGING — DX DG ABDOMEN ACUTE W/ 1V CHEST
3 series · 3 of 3 positions shown · non-contrast
Comparison: None.

CLINICAL DATA: 22-year-old male with a history of vomiting

EXAM:
DG ABDOMEN ACUTE W/ 1V CHEST

[chest pa]
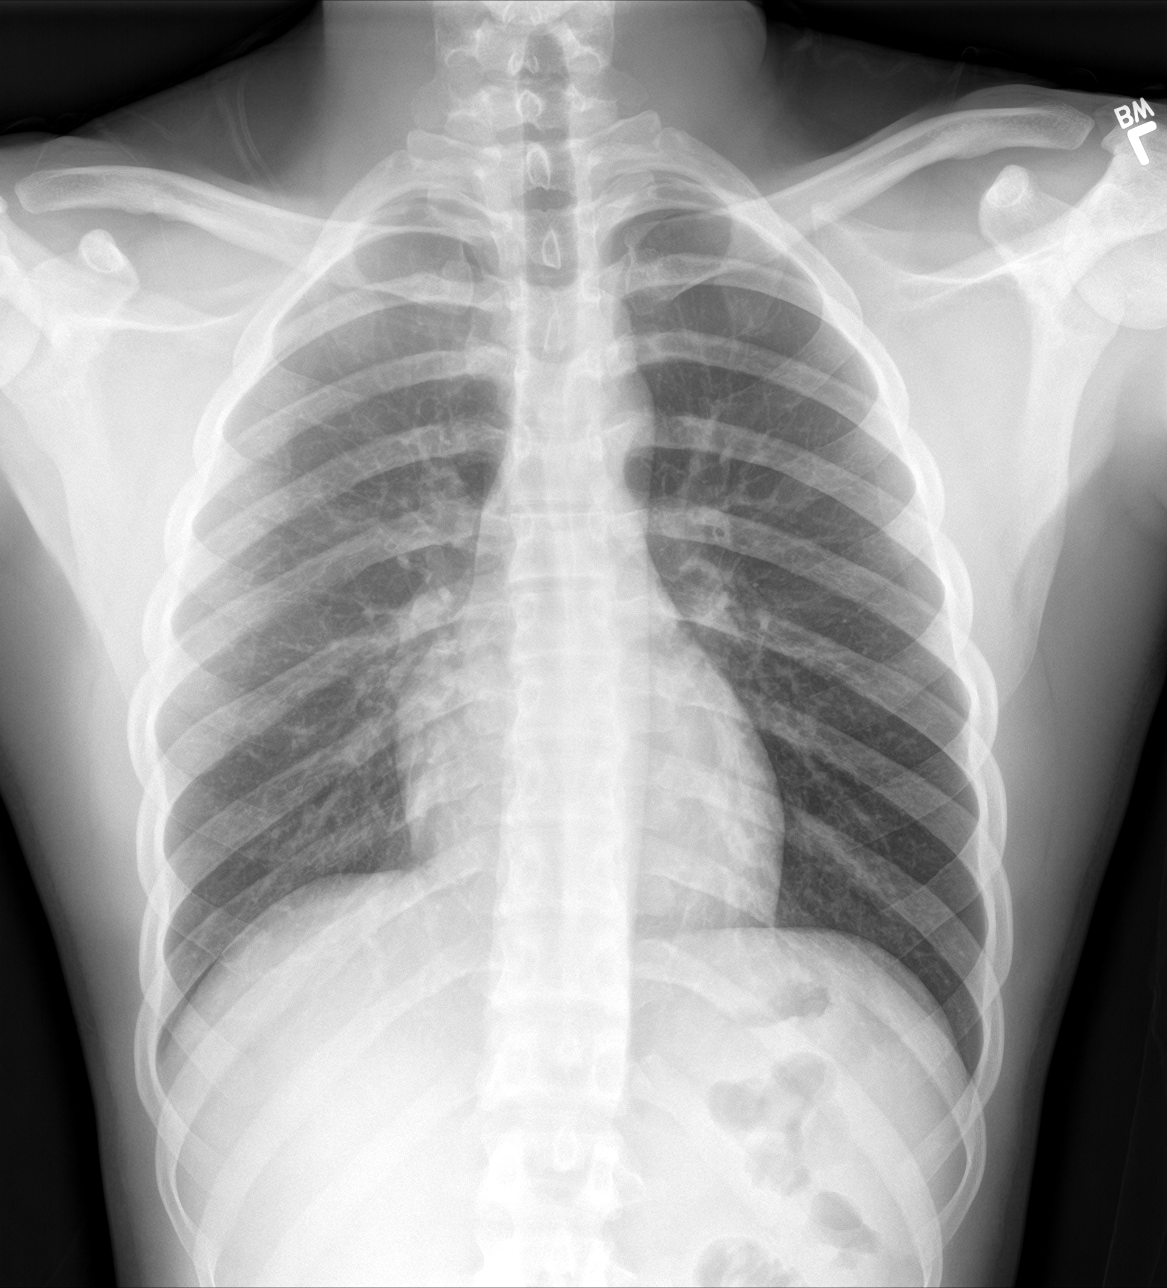

[abdomen erect]
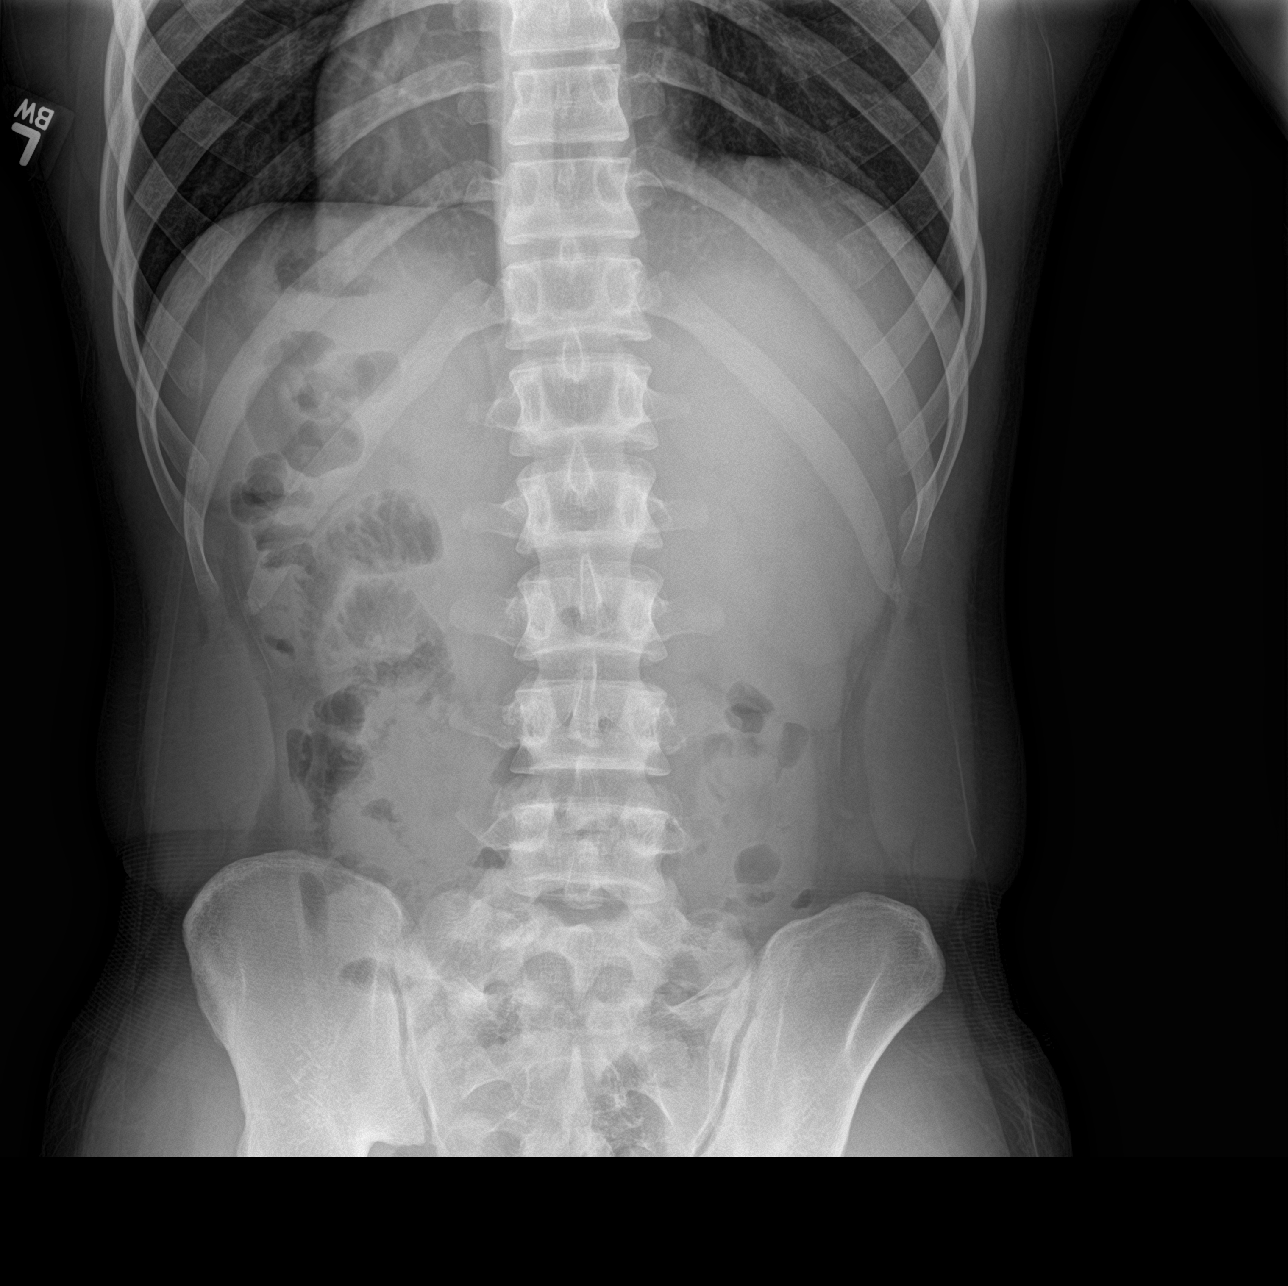

[abdomen supine]
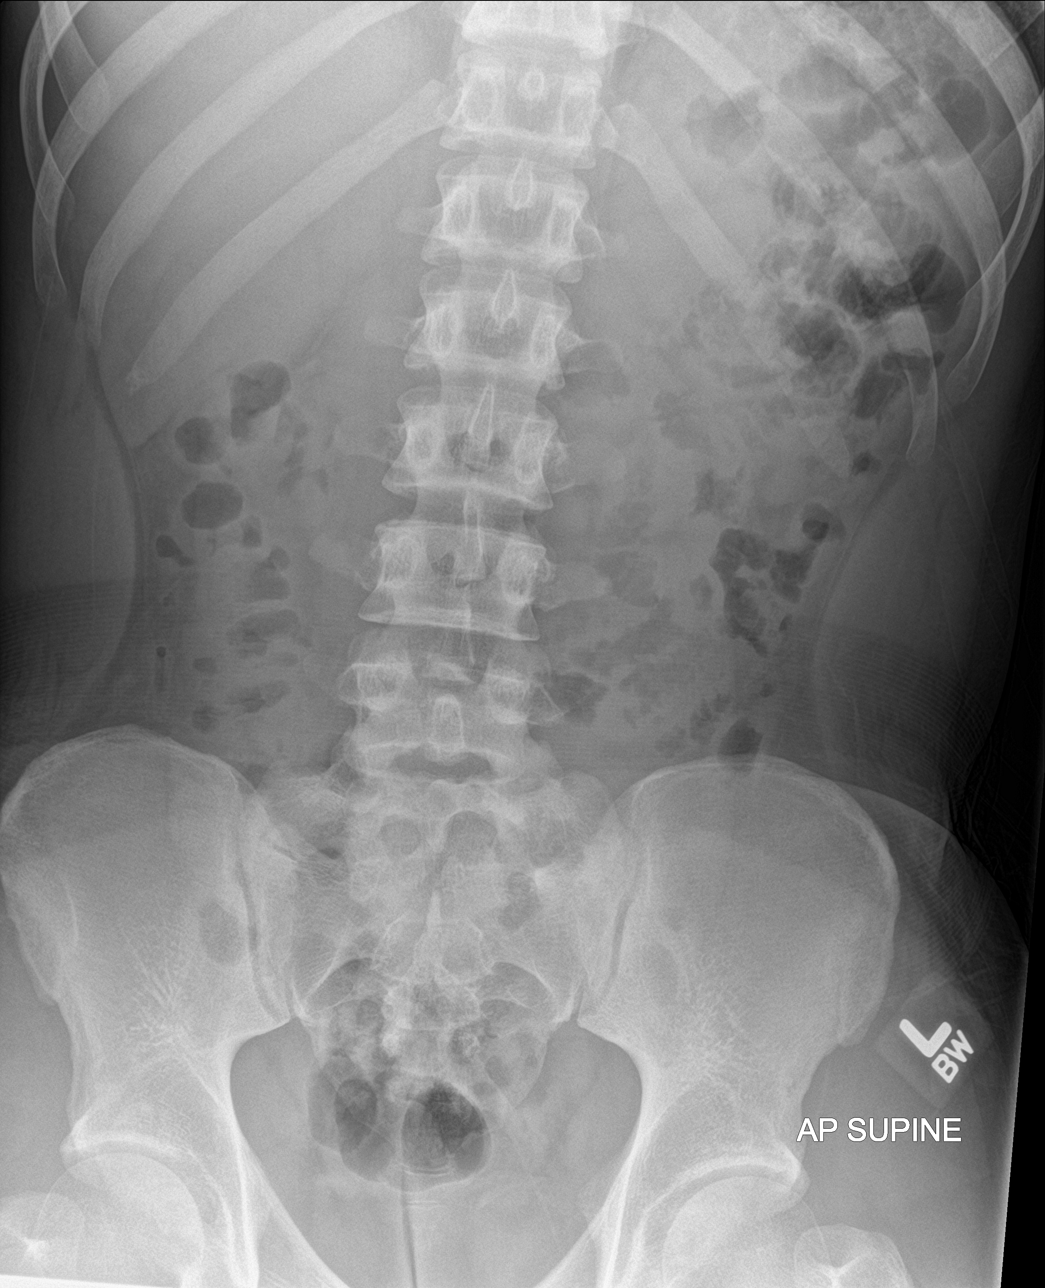

[3 of 3 positions shown; findings below may reference images not displayed]

FINDINGS: Chest:

Cardiomediastinal silhouette within normal limits in size and
contour. No evidence of central vascular congestion. No pneumothorax
or pleural effusion.

Abdomen:

Gas within stomach, small bowel, colon. No abnormal distension. No
air-fluid levels.

No radiopaque foreign body. No unexpected soft tissue density or
calcification.

No displaced fracture
IMPRESSION: Chest:

No radiographic evidence of acute cardiopulmonary disease.

Abdomen:

Normal bowel gas pattern.

## 2023-09-26 ENCOUNTER — Other Ambulatory Visit: Payer: Self-pay

## 2023-09-26 ENCOUNTER — Emergency Department (HOSPITAL_COMMUNITY)
Admission: EM | Admit: 2023-09-26 | Discharge: 2023-09-26 | Disposition: A | Discharge status: Home / Self Care | Attending: Emergency Medicine | Admitting: Emergency Medicine

## 2023-09-26 ENCOUNTER — Encounter (HOSPITAL_COMMUNITY): Payer: Self-pay | Admitting: *Deleted

## 2023-09-26 DIAGNOSIS — R112 Nausea with vomiting, unspecified: Secondary | ICD-10-CM

## 2023-09-26 DIAGNOSIS — K529 Noninfective gastroenteritis and colitis, unspecified: Secondary | ICD-10-CM | POA: Insufficient documentation

## 2023-09-26 LAB — URINALYSIS, ROUTINE W REFLEX MICROSCOPIC
Bilirubin Urine: NEGATIVE
Glucose, UA: NEGATIVE mg/dL
Ketones, ur: 80 mg/dL — AB
Leukocytes,Ua: NEGATIVE
Nitrite: NEGATIVE
Protein, ur: 30 mg/dL — AB
Specific Gravity, Urine: 1.033 — ABNORMAL HIGH (ref 1.005–1.030)
pH: 5 (ref 5.0–8.0)

## 2023-09-26 LAB — CBC
HCT: 45.2 % (ref 39.0–52.0)
Hemoglobin: 15.6 g/dL (ref 13.0–17.0)
MCH: 27.9 pg (ref 26.0–34.0)
MCHC: 34.5 g/dL (ref 30.0–36.0)
MCV: 80.9 fL (ref 80.0–100.0)
Platelets: 382 10*3/uL (ref 150–400)
RBC: 5.59 MIL/uL (ref 4.22–5.81)
RDW: 13 % (ref 11.5–15.5)
WBC: 14.4 10*3/uL — ABNORMAL HIGH (ref 4.0–10.5)
nRBC: 0 % (ref 0.0–0.2)

## 2023-09-26 LAB — RESP PANEL BY RT-PCR (RSV, FLU A&B, COVID)  RVPGX2
Influenza A by PCR: NEGATIVE
Influenza B by PCR: NEGATIVE
Resp Syncytial Virus by PCR: NEGATIVE
SARS Coronavirus 2 by RT PCR: NEGATIVE

## 2023-09-26 LAB — COMPREHENSIVE METABOLIC PANEL WITH GFR
ALT: 14 U/L (ref 0–44)
AST: 18 U/L (ref 15–41)
Albumin: 4.6 g/dL (ref 3.5–5.0)
Alkaline Phosphatase: 48 U/L (ref 38–126)
Anion gap: 12 (ref 5–15)
BUN: 12 mg/dL (ref 6–20)
CO2: 24 mmol/L (ref 22–32)
Calcium: 9.8 mg/dL (ref 8.9–10.3)
Chloride: 102 mmol/L (ref 98–111)
Creatinine, Ser: 1.32 mg/dL — ABNORMAL HIGH (ref 0.61–1.24)
GFR, Estimated: >60 mL/min (ref >60)
Glucose, Bld: 130 mg/dL — ABNORMAL HIGH (ref 70–99)
Potassium: 3.9 mmol/L (ref 3.5–5.1)
Sodium: 138 mmol/L (ref 135–145)
Total Bilirubin: 0.9 mg/dL (ref 0.0–1.2)
Total Protein: 7.8 g/dL (ref 6.5–8.1)

## 2023-09-26 LAB — LIPASE, BLOOD: Lipase: 53 U/L — ABNORMAL HIGH (ref 11–51)

## 2023-09-26 MED ORDER — DICYCLOMINE HCL 20 MG PO TABS: 20.0000 mg | ORAL_TABLET | Freq: Two times a day (BID) | ORAL | 0 refills | Status: AC

## 2023-09-26 MED ORDER — DICYCLOMINE HCL 10 MG/ML IM SOLN
10.0000 mg | Freq: Once | INTRAMUSCULAR | Status: AC
  Administered 2023-09-26: 10 mg via INTRAMUSCULAR
  Filled 2023-09-26: qty 2

## 2023-09-26 MED ORDER — SODIUM CHLORIDE 0.9 % IV BOLUS
1000.0000 mL | Freq: Once | INTRAVENOUS | Status: AC
  Administered 2023-09-26: 1000 mL via INTRAVENOUS

## 2023-09-26 MED ORDER — ONDANSETRON HCL 4 MG/2ML IJ SOLN
4.0000 mg | Freq: Once | INTRAMUSCULAR | Status: AC
Start: 2023-09-26 — End: 2023-09-26
  Administered 2023-09-26: 4 mg via INTRAVENOUS
  Filled 2023-09-26: qty 2

## 2023-09-26 MED ORDER — METOCLOPRAMIDE HCL 5 MG/ML IJ SOLN
5.0000 mg | Freq: Once | INTRAMUSCULAR | Status: AC
Start: 2023-09-26 — End: 2023-09-26
  Administered 2023-09-26: 5 mg via INTRAVENOUS
  Filled 2023-09-26: qty 2

## 2023-09-26 MED ORDER — ONDANSETRON 4 MG PO TBDP
4.0000 mg | ORAL_TABLET | Freq: Once | ORAL | Status: AC | PRN
  Administered 2023-09-26: 4 mg via ORAL
  Filled 2023-09-26: qty 1

## 2023-09-26 MED ORDER — ONDANSETRON 4 MG PO TBDP
4.0000 mg | ORAL_TABLET | Freq: Three times a day (TID) | ORAL | 0 refills | Status: DC | PRN
Start: 2023-09-26 — End: 2023-09-26

## 2023-09-26 MED ORDER — PROCHLORPERAZINE EDISYLATE 10 MG/2ML IJ SOLN
10.0000 mg | Freq: Once | INTRAMUSCULAR | Status: DC
  Filled 2023-09-26: qty 2

## 2023-09-26 MED ORDER — DROPERIDOL 2.5 MG/ML IJ SOLN: 1.2500 mg | Freq: Once | INTRAMUSCULAR | Status: DC

## 2023-09-26 MED ORDER — DICYCLOMINE HCL 20 MG PO TABS: 20.0000 mg | ORAL_TABLET | Freq: Two times a day (BID) | ORAL | 0 refills | Status: DC

## 2023-09-26 MED ORDER — ONDANSETRON 4 MG PO TBDP: 4.0000 mg | ORAL_TABLET | Freq: Three times a day (TID) | ORAL | 0 refills | Status: AC | PRN

## 2023-09-26 MED ORDER — DIPHENHYDRAMINE HCL 50 MG/ML IJ SOLN: 25.0000 mg | Freq: Once | INTRAMUSCULAR | Status: DC

## 2023-09-26 NOTE — Discharge Instructions (Addendum)
 As discussed, your labs show signs of dehydration but they are otherwise reassuring.  Stick to a bland diet for the next couple days to not upset your stomach.  I have sent a a prescription of Bentyl  to your pharmacy can take twice a day as needed for abdominal pain. I have sent an additional prescription of Zofran  to use under the tongue every 8 hours as needed for nausea/vomiting.  Follow-up with your primary care fighter the next week for reevaluation.  Get help right away if: You have pain in your chest, neck, arm, or jaw. You feel very weak or you faint. You vomit again and again. You have vomit that is bright red or looks like black coffee grounds. You have bloody or black poop (stools) or poop that looks like tar. You have a very bad headache, a stiff neck, or both. You have very bad pain, cramping, or bloating in your belly (abdomen). You have trouble breathing. You are breathing very quickly. Your heart is beating very quickly. Your skin feels cold and clammy. You feel confused. You have signs of losing too much water in your body, such as: Dark pee, very little pee, or no pee. Cracked lips. Dry mouth. Sunken eyes. Sleepiness. Weakness.

## 2023-09-26 NOTE — ED Triage Notes (Signed)
 C/o vomiting onset yest  c/o diarrhea ,

## 2023-09-26 NOTE — ED Provider Notes (Signed)
 [place] EMERGENCY DEPARTMENT AT [facility] Provider Note   CSN: [id number] Arrival date & time: 09/26/23  0901     History  Chief Complaint  Patient presents with   Emesis    Luke Lopez is a 28 y.o. male with no significant past medical history presents ED today for emesis.  Patient reports nausea, vomiting, and abdominal discomfort for the past 2 days.  States that he has not been able to tolerate anything by mouth since yesterday afternoon.  No fevers.  Endorses some diarrhea.  Reports multiple episodes of emesis 4 days ago after eating [employer].  Denies any when also similar symptoms.  Patient was at a wedding over the weekend and had some alcohol use.  Denies any recent marijuana use.  No previous abdominal surgeries.    Home Medications Prior to Admission medications   Medication Sig Start Date End Date Taking? Authorizing Provider  dicyclomine  (BENTYL ) 20 MG tablet Take 1 tablet (20 mg total) by mouth 2 (two) times daily for 7 days. 09/26/23 10/03/23 Yes Sonnie Dusky, PA-C  ondansetron  (ZOFRAN -ODT) 4 MG disintegrating tablet Take 1 tablet (4 mg total) by mouth every 8 (eight) hours as needed for nausea or vomiting. 09/26/23 10/26/23 Yes Sonnie Dusky, PA-C      Allergies    Patient has no known allergies.    Review of Systems   Review of Systems  Gastrointestinal:  Positive for vomiting.  All other systems reviewed and are negative.   Physical Exam Updated Vital Signs BP (!) 141/80   Pulse 100   Temp 98.2 F (36.8 C) (Oral)   Resp 16   Ht 5\' 10"  (1.778 m)   Wt 90.7 kg   SpO2 100%   BMI 28.70 kg/m  Physical Exam Vitals and nursing note reviewed.  Constitutional:      General: He is not in acute distress.    Appearance: Normal appearance.  HENT:     Head: Normocephalic and atraumatic.     Mouth/Throat:     Mouth: Mucous membranes are moist.  Eyes:     Conjunctiva/sclera: Conjunctivae normal.     Pupils: Pupils are equal, round,  and reactive to light.  Cardiovascular:     Rate and Rhythm: Normal rate and regular rhythm.     Pulses: Normal pulses.     Heart sounds: Normal heart sounds.  Pulmonary:     Effort: Pulmonary effort is normal.     Breath sounds: Normal breath sounds.  Abdominal:     Palpations: Abdomen is soft.     Tenderness: There is no abdominal tenderness.  Musculoskeletal:        General: Normal range of motion.     Cervical back: Normal range of motion.  Skin:    General: Skin is warm and dry.     Findings: No rash.  Neurological:     General: No focal deficit present.     Mental Status: He is alert.  Psychiatric:        Mood and Affect: Mood normal.        Behavior: Behavior normal.    ED Results / Procedures / Treatments   Labs (all labs ordered are listed, but only abnormal results are displayed) Labs Reviewed  LIPASE, BLOOD - Abnormal; Notable for the following components:      Result Value   Lipase 53 (*)    All other components within normal limits  COMPREHENSIVE METABOLIC PANEL WITH GFR - Abnormal; Notable for the  following components:   Glucose, Bld 130 (*)    Creatinine, Ser 1.32 (*)    All other components within normal limits  CBC - Abnormal; Notable for the following components:   WBC 14.4 (*)    All other components within normal limits  URINALYSIS, ROUTINE W REFLEX MICROSCOPIC - Abnormal; Notable for the following components:   Specific Gravity, Urine 1.033 (*)    Hgb urine dipstick SMALL (*)    Ketones, ur 80 (*)    Protein, ur 30 (*)    Bacteria, UA RARE (*)    All other components within normal limits  RESP PANEL BY RT-PCR (RSV, FLU A&B, COVID)  [id number]    EKG EKG Interpretation Date/Time:  Monday Sep 26 2023 14:56:26 EDT Ventricular Rate:  58 PR Interval:  99 QRS Duration:  104 QT Interval:  423 QTC Calculation: 416 R Axis:   44  Text Interpretation: Sinus rhythm Short PR interval Anterolateral Q wave, probably normal for age Confirmed by Shyrl Doyne [phone]) on 09/26/2023 3:26:56 PM  Radiology No results found.  Procedures Procedures    Medications Ordered in ED Medications  ondansetron  (ZOFRAN -ODT) disintegrating tablet 4 mg (4 mg Oral Given 09/26/23 0922)  sodium chloride  0.9 % bolus 1,000 mL (0 mLs Intravenous Stopped 09/26/23 1114)  dicyclomine  (BENTYL ) injection 10 mg (10 mg Intramuscular Given 09/26/23 1023)  metoCLOPramide  (REGLAN ) injection 5 mg (5 mg Intravenous Given 09/26/23 1020)  sodium chloride  0.9 % bolus 1,000 mL (0 mLs Intravenous Stopped 09/26/23 1241)  dicyclomine  (BENTYL ) injection 10 mg (10 mg Intramuscular Given 09/26/23 1239)  sodium chloride  0.9 % bolus 1,000 mL (1,000 mLs Intravenous New Bag/Given 09/26/23 1431)  ondansetron  (ZOFRAN ) injection 4 mg (4 mg Intravenous Given 09/26/23 1428)    ED Course/ Medical Decision Making/ A&P                                 Medical Decision Making Amount and/or Complexity of Data Reviewed Labs: ordered.  Risk Prescription drug management.   This patient presents to the ED for concern of nausea and vomiting, this involves an extensive number of treatment options, and is a complaint that carries with it a high risk of complications and morbidity.   Differential diagnosis includes: gastroenteritis, gastritis, pancreatitis, IBS, IBD, cannabinoid hyperemesis syndrome, etc.    Comorbidities  See HPI above   Additional History  Additional history obtained from records   Cardiac Monitoring / EKG  The patient was maintained on a cardiac monitor.  I personally viewed and interpreted the cardiac monitored which showed: sinus rhythm with a heart rate of 58 bpm.   Lab Tests  I ordered and personally interpreted labs.  The pertinent results include:   Creatinine of 1.32 otherwise CMP is reassuring Lipase slightly elevated at 53 WBC 14.4 otherwise unremarkable Negative respiratory panel UA shows protein and ketones otherwise no signs of infection   Imaging  Studies  Patient denies abdominal tenderness to palpation on exam.  No imaging ordered.   Problem List / ED Course / Critical Interventions / Medication Management  Patient has been having generalized abdominal pain with nausea, vomiting, diarrhea for the past 2 days.  States that he was awake this weekend and had [employer] on the way to the wedding and started to have some vomiting then, but it stopped until yesterday.  Patient significant other is at bedside and does not have similar symptoms resolved. He has  history of cannabinoid hyperemesis syndrome in the past, has not used marijuana in the past month.  Was drinking at the wedding this weekend.  Has not eaten anything since symptoms started due to nausea, vomiting, and generalized abdominal discomfort.  On my examination, he does not report any pain to palpation of the abdomen. I ordered medications including: Reglan , Bentyl , and NS for vomiting and dehydration  Reevaluation of the patient after these medicines showed that the patient improved.  Patient reported having episode of vomiting after drinking water.  More Bentyl  and Zofran  were given. On reevaluation, patient was able to eat some fruit and some applesauce without any nausea or vomiting.  Patient was tachycardic with a heart rate of 109.  Another liter of fluids was ordered. Patient staffed with my attending, Dr. Efraim Grange.  Social Determinants of Health  Access to healthcare   Test / Admission - Considered  Discussed findings with patient.  All questions were answered. He is stable and safe for discharge home. Return precautions given.       Final Clinical Impression(s) / ED Diagnoses Final diagnoses:  Nausea vomiting and diarrhea  Gastroenteritis    Rx / DC Orders ED Discharge Orders          Ordered    dicyclomine  (BENTYL ) 20 MG tablet  2 times daily        09/26/23 1510    ondansetron  (ZOFRAN -ODT) 4 MG disintegrating tablet  Every 8 hours PRN         09/26/23 1528              Sonnie Dusky, PA-C 09/26/23 1531    Ninetta Basket, MD 09/27/23 1420
